# Patient Record
Sex: Male | Born: 2000
Health system: Southern US, Community
[De-identification: ages and names within clinical notes are randomized; demographics above are authoritative.]

## PROBLEM LIST (undated history)

## (undated) DIAGNOSIS — F909 Attention-deficit hyperactivity disorder, unspecified type: Secondary | ICD-10-CM

## (undated) DIAGNOSIS — F988 Other specified behavioral and emotional disorders with onset usually occurring in childhood and adolescence: Secondary | ICD-10-CM

## (undated) DIAGNOSIS — F259 Schizoaffective disorder, unspecified: Secondary | ICD-10-CM

## (undated) HISTORY — PX: CARDIAC SURGERY: SHX584

## (undated) HISTORY — PX: ASD REPAIR: SHX258

---
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
## 2020-01-28 ENCOUNTER — Encounter (HOSPITAL_COMMUNITY): Payer: Self-pay | Admitting: Emergency Medicine

## 2020-01-28 ENCOUNTER — Emergency Department (HOSPITAL_COMMUNITY)
Admission: EM | Admit: 2020-01-28 | Discharge: 2020-01-29 | Disposition: A | Discharge status: Home / Self Care | Payer: Medicaid Other | Attending: Emergency Medicine | Admitting: Emergency Medicine

## 2020-01-28 ENCOUNTER — Other Ambulatory Visit: Payer: Self-pay

## 2020-01-28 DIAGNOSIS — R0602 Shortness of breath: Secondary | ICD-10-CM | POA: Diagnosis not present

## 2020-01-28 DIAGNOSIS — F909 Attention-deficit hyperactivity disorder, unspecified type: Secondary | ICD-10-CM | POA: Diagnosis not present

## 2020-01-28 DIAGNOSIS — F419 Anxiety disorder, unspecified: Secondary | ICD-10-CM

## 2020-01-28 DIAGNOSIS — R202 Paresthesia of skin: Secondary | ICD-10-CM | POA: Insufficient documentation

## 2020-01-28 DIAGNOSIS — R531 Weakness: Secondary | ICD-10-CM | POA: Diagnosis present

## 2020-01-28 DIAGNOSIS — R002 Palpitations: Secondary | ICD-10-CM | POA: Insufficient documentation

## 2020-01-28 DIAGNOSIS — F17228 Nicotine dependence, chewing tobacco, with other nicotine-induced disorders: Secondary | ICD-10-CM | POA: Diagnosis not present

## 2020-01-28 DIAGNOSIS — F17210 Nicotine dependence, cigarettes, uncomplicated: Secondary | ICD-10-CM | POA: Diagnosis not present

## 2020-01-28 DIAGNOSIS — F17290 Nicotine dependence, other tobacco product, uncomplicated: Secondary | ICD-10-CM | POA: Insufficient documentation

## 2020-01-28 LAB — CBC WITH DIFFERENTIAL/PLATELET
Abs Immature Granulocytes: 0.03 10*3/uL (ref 0.00–0.07)
Basophils Absolute: 0 10*3/uL (ref 0.0–0.1)
Basophils Relative: 0 %
Eosinophils Absolute: 0.2 10*3/uL (ref 0.0–0.5)
Eosinophils Relative: 1 %
HCT: 49 % (ref 39.0–52.0)
Hemoglobin: 16.1 g/dL (ref 13.0–17.0)
Immature Granulocytes: 0 %
Lymphocytes Relative: 24 %
Lymphs Abs: 3.1 10*3/uL (ref 0.7–4.0)
MCH: 29.8 pg (ref 26.0–34.0)
MCHC: 32.9 g/dL (ref 30.0–36.0)
MCV: 90.6 fL (ref 80.0–100.0)
Monocytes Absolute: 1.1 10*3/uL — ABNORMAL HIGH (ref 0.1–1.0)
Monocytes Relative: 8 %
Neutro Abs: 8.7 10*3/uL — ABNORMAL HIGH (ref 1.7–7.7)
Neutrophils Relative %: 67 %
Platelets: 361 10*3/uL (ref 150–400)
RBC: 5.41 MIL/uL (ref 4.22–5.81)
RDW: 13.5 % (ref 11.5–15.5)
WBC: 13.1 10*3/uL — ABNORMAL HIGH (ref 4.0–10.5)
nRBC: 0 % (ref 0.0–0.2)

## 2020-01-28 LAB — COMPREHENSIVE METABOLIC PANEL
ALT: 23 U/L (ref 0–44)
AST: 22 U/L (ref 15–41)
Albumin: 4.6 g/dL (ref 3.5–5.0)
Alkaline Phosphatase: 76 U/L (ref 38–126)
Anion gap: 12 (ref 5–15)
BUN: 20 mg/dL (ref 6–20)
CO2: 25 mmol/L (ref 22–32)
Calcium: 9.4 mg/dL (ref 8.9–10.3)
Chloride: 101 mmol/L (ref 98–111)
Creatinine, Ser: 0.87 mg/dL (ref 0.61–1.24)
GFR calc Af Amer: >60 mL/min (ref >60)
GFR calc non Af Amer: >60 mL/min (ref >60)
Glucose, Bld: 91 mg/dL (ref 70–99)
Potassium: 3.6 mmol/L (ref 3.5–5.1)
Sodium: 138 mmol/L (ref 135–145)
Total Bilirubin: 1.3 mg/dL — ABNORMAL HIGH (ref 0.3–1.2)
Total Protein: 7.7 g/dL (ref 6.5–8.1)

## 2020-01-28 LAB — URINALYSIS, ROUTINE W REFLEX MICROSCOPIC
Bilirubin Urine: NEGATIVE
Glucose, UA: NEGATIVE mg/dL
Hgb urine dipstick: NEGATIVE
Ketones, ur: 5 mg/dL — AB
Leukocytes,Ua: NEGATIVE
Nitrite: NEGATIVE
Protein, ur: NEGATIVE mg/dL
Specific Gravity, Urine: 1.02 (ref 1.005–1.030)
pH: 6 (ref 5.0–8.0)

## 2020-01-28 LAB — LIPASE, BLOOD: Lipase: 21 U/L (ref 11–51)

## 2020-01-28 MED ORDER — SODIUM CHLORIDE 0.9% FLUSH
3.0000 mL | Freq: Once | INTRAVENOUS | Status: AC
  Administered 2020-01-28: 3 mL via INTRAVENOUS

## 2020-01-28 NOTE — ED Triage Notes (Signed)
Patient complains of abdominal pain that started today with some nausea. Denies vomiting diarrhea.

## 2020-01-28 NOTE — Discharge Instructions (Addendum)
Your lab tests and exam tonight are reassuring with no signs of dehydration, electrolyte abnormalities and with normal liver and kidney function.  Rest and make sure you are drinking plenty of fluids.  I encourage you to follow up with Carlos Pineda for a recheck of your symptoms and also to explore other options for managing your anxiety and paranoia.  She should know that you are not taking the medicine she prescribed you for this condition.    You can return here also if you have any worsening symptoms.

## 2020-01-30 NOTE — ED Provider Notes (Signed)
[facility] EMERGENCY DEPARTMENT Provider Note   CSN: [id number] Arrival date & time: 01/28/20  1813     History Chief Complaint  Patient presents with  . Abdominal Pain    Carlos Pineda is a 19 y.o. male with a history of ADHD,paranoia, anxiety and prior suicidal ideation presenting for evaluation of generalized weakness, palpitations and shortness of breath which occurred prior to arrival.  He reports sitting at home when he stood up suddenly and felt weak, like his legs almost wouldn't hold him up, but also reports his arms felt weak as well.  He felt his heart was racing, he felt sob and also felt some tingling in his hands.  Somewhat similar to prior anxiety episodes.  Denies chest pain, had nausea without vomiting, also abdominal pain which has resolved.  Also no cough, fevers, vomiting or diarrhea.  He has had no treatment prior to arrival.  Denies any reason for dehydration, has been eating/drinking well.  No tx prior to arrival.   The history is provided by the patient.       Past Medical History:  Diagnosis Date  . ADD (attention deficit disorder)   . ADHD (attention deficit hyperactivity disorder)     Patient Active Problem List   Diagnosis Date Noted  . MDD (major depressive disorder), recurrent, severe, with psychosis (HCC) 12/12/2017    Past Surgical History:  Procedure Laterality Date  . ASD REPAIR    . CARDIAC SURGERY         No family history on file.  Social History   Tobacco Use  . Smoking status: Current Every Day Smoker    Types: E-cigarettes, Cigarettes  . Smokeless tobacco: Current User    Types: Snuff  Substance Use Topics  . Alcohol use: No    Comment: Former ETOH use  . Drug use: No    Comment: Former marijuana use    Home Medications Prior to Admission medications   Medication Sig Start Date End Date Taking? Authorizing Provider  ibuprofen (ADVIL) 200 MG tablet Take 400 mg by mouth every 6 (six) hours as needed for mild pain or  moderate pain.   Yes [provider]  loratadine (ALLERGY RELIEF) 10 MG tablet Take 10 mg by mouth daily as needed for allergies.   Yes [provider]    Allergies    Other  Review of Systems   Review of Systems  Constitutional: Negative for chills and fever.  HENT: Negative for congestion and sore throat.   Eyes: Negative.   Respiratory: Positive for shortness of breath. Negative for chest tightness.   Cardiovascular: Positive for palpitations. Negative for chest pain.  Gastrointestinal: Positive for nausea. Negative for abdominal pain, diarrhea and vomiting.  Genitourinary: Negative.   Musculoskeletal: Negative for arthralgias, joint swelling and neck pain.  Skin: Negative.  Negative for rash and wound.  Neurological: Positive for weakness and numbness. Negative for dizziness, light-headedness and headaches.  Psychiatric/Behavioral: The patient is nervous/anxious.     Physical Exam Updated Vital Signs BP 129/78 (BP Location: Right Arm)   Pulse 79   Temp 98 F (36.7 C) (Oral)   Resp 17   Ht 5\' 7"  (1.702 m)   Wt 63.5 kg   SpO2 99%   BMI 21.93 kg/m   Physical Exam Vitals and nursing note reviewed.  Constitutional:      Appearance: He is well-developed.  HENT:     Head: Normocephalic and atraumatic.  Eyes:     Conjunctiva/sclera: Conjunctivae  normal.  Cardiovascular:     Rate and Rhythm: Normal rate and regular rhythm.     Heart sounds: Normal heart sounds.  Pulmonary:     Effort: Pulmonary effort is normal.     Breath sounds: Normal breath sounds. No wheezing.  Abdominal:     General: Abdomen is flat. Bowel sounds are normal.     Palpations: Abdomen is soft.     Tenderness: There is no abdominal tenderness.     Comments: Normal abd exam.  No guarding, nontender.   Musculoskeletal:        General: Normal range of motion.     Cervical back: Normal range of motion.  Skin:    General: Skin is warm and dry.  Neurological:     General: No focal  deficit present.     Mental Status: He is alert and oriented to person, place, and time.     Cranial Nerves: Cranial nerves are intact. No dysarthria.     Sensory: Sensation is intact.     Motor: Motor function is intact. No weakness or pronator drift.     Coordination: Coordination is intact. Heel to [facility] Test normal.     Comments: Equal grip strength.  Psychiatric:        Attention and Perception: Attention normal.        Mood and Affect: Affect is flat.        Speech: Speech normal.        Behavior: Behavior normal.        Thought Content: Thought content normal.     Comments: Pt with mild flat affect.  Occasional brief episodes of direct eye contact.  Denies SI/HI.      ED Results / Procedures / Treatments   Labs (all labs ordered are listed, but only abnormal results are displayed) Labs Reviewed  COMPREHENSIVE METABOLIC PANEL - Abnormal; Notable for the following components:      Result Value   Total Bilirubin 1.3 (*)    All other components within normal limits  URINALYSIS, ROUTINE W REFLEX MICROSCOPIC - Abnormal; Notable for the following components:   Ketones, ur 5 (*)    All other components within normal limits  CBC WITH DIFFERENTIAL/PLATELET - Abnormal; Notable for the following components:   WBC 13.1 (*)    Neutro Abs 8.7 (*)    Monocytes Absolute 1.1 (*)    All other components within normal limits  LIPASE, BLOOD    EKG None  Radiology No results found.  Procedures Procedures (including critical care time)  Medications Ordered in ED Medications  sodium chloride flush (NS) 0.9 % injection 3 mL (3 mLs Intravenous Given 01/28/20 2237)    ED Course  I have reviewed the triage vital signs and the nursing notes.  Pertinent labs & imaging results that were available during my care of the patient were reviewed by me and considered in my medical decision making (see chart for details).    MDM Rules/Calculators/A&P                     Pt with normal exam  without abdominal pain here, no sx elicited with exam.  Ambulatory without weakness, VSS, labs unremarkable except for an elevated wbc count of unclear etiology, suspect from acute stress reaction from suspected anxiety/panic attack.  He reports used to be on prozac and another medication for anxiety and paranoia, stopped taking last summer.  Advised f/u with pcp to discuss other options for managing these  sx, who is not aware he has stopped taking.  Also discussed return here sooner for any escalation of sx.   Pt denies SI.  Final Clinical Impression(s) / ED Diagnoses Final diagnoses:  Episode of generalized weakness  Anxiety    Rx / DC Orders ED Discharge Orders    None       Landis Martins 01/30/20 1629    Sherwood Gambler, MD 01/30/20 1735

## 2020-07-29 DIAGNOSIS — R079 Chest pain, unspecified: Secondary | ICD-10-CM | POA: Diagnosis not present

## 2020-07-29 DIAGNOSIS — R0600 Dyspnea, unspecified: Secondary | ICD-10-CM | POA: Diagnosis not present

## 2020-08-23 ENCOUNTER — Emergency Department (HOSPITAL_COMMUNITY)
Admission: EM | Admit: 2020-08-23 | Discharge: 2020-08-23 | Disposition: A | Discharge status: Home / Self Care | Payer: Medicaid Other | Attending: Emergency Medicine | Admitting: Emergency Medicine

## 2020-08-23 ENCOUNTER — Emergency Department (HOSPITAL_COMMUNITY): Payer: Medicaid Other

## 2020-08-23 ENCOUNTER — Other Ambulatory Visit: Payer: Self-pay

## 2020-08-23 ENCOUNTER — Encounter (HOSPITAL_COMMUNITY): Payer: Self-pay | Admitting: Emergency Medicine

## 2020-08-23 DIAGNOSIS — R519 Headache, unspecified: Secondary | ICD-10-CM | POA: Diagnosis present

## 2020-08-23 DIAGNOSIS — U071 COVID-19: Secondary | ICD-10-CM | POA: Diagnosis not present

## 2020-08-23 DIAGNOSIS — F17210 Nicotine dependence, cigarettes, uncomplicated: Secondary | ICD-10-CM | POA: Diagnosis not present

## 2020-08-23 DIAGNOSIS — F909 Attention-deficit hyperactivity disorder, unspecified type: Secondary | ICD-10-CM | POA: Diagnosis not present

## 2020-08-23 LAB — SARS CORONAVIRUS 2 BY RT PCR (HOSPITAL ORDER, PERFORMED IN ~~LOC~~ HOSPITAL LAB): SARS Coronavirus 2: POSITIVE — AB

## 2020-08-23 NOTE — ED Provider Notes (Signed)
[facility] EMERGENCY DEPARTMENT Provider Note   CSN: [id number] Arrival date & time: 08/23/20  0025   Time seen 2:47 AM  History Chief Complaint  Patient presents with  . Multiple Complaints    Carlos Pineda is a 19 y.o. male.  HPI   Patient states Thursday, September 9 he started feeling "weird".  He states he could not sleep.  The following day he had a headache that in the front just hurts in the back is spiky he has had some nausea but no vomiting except when he induced it.  He denies diarrhea.  He has had a mild cough and mild sore throat and some mild rhinorrhea without sneezing.  He denies loss of sense of taste or smell.  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  Patient has not had the Covid vaccine  PCP April Manson, NP   Past Medical History:  Diagnosis Date  . ADD (attention deficit disorder)   . ADHD (attention deficit hyperactivity disorder)     Patient Active Problem List   Diagnosis Date Noted  . MDD (major depressive disorder), recurrent, severe, with psychosis (HCC) 12/12/2017    Past Surgical History:  Procedure Laterality Date  . ASD REPAIR    . CARDIAC SURGERY         No family history on file.  Social History   Tobacco Use  . Smoking status: Current Every Day Smoker    Types: E-cigarettes, Cigarettes  . Smokeless tobacco: Current User    Types: Snuff  Vaping Use  . Vaping Use: Never used  Substance Use Topics  . Alcohol use: Yes    Alcohol/week: 24.0 standard drinks    Types: 24 Cans of beer per week    Comment: Former ETOH use  . Drug use: No    Comment: Former marijuana use  States he quit his job in the past couple days  Home Medications Prior to Admission medications   Medication Sig Start Date End Date Taking? Authorizing Provider  ibuprofen (ADVIL) 200 MG tablet Take 400 mg by mouth every 6 (six) hours as needed for mild pain or moderate pain.    [provider]  loratadine (ALLERGY RELIEF) 10 MG tablet Take 10 mg by mouth daily as needed for allergies.    [provider]    Allergies    Other  Review of Systems   Review of Systems  All other systems reviewed and are negative.   Physical Exam Updated Vital Signs BP 138/81 (BP Location: Right Arm)   Pulse 80   Temp 99.3 F (37.4 C) (Oral)   Resp 18   Ht 5\' 7"  (1.702 m)   Wt 63.5 kg   SpO2 99%   BMI 21.93 kg/m   Physical Exam Vitals and nursing note reviewed.  Constitutional:      General: He is not in acute distress.    Appearance: Normal appearance. He is normal weight.  HENT:     Head: Normocephalic and atraumatic.     Right Ear: External ear normal.     Left Ear: External ear normal.     Nose: Nose normal.     Mouth/Throat:     Mouth: Mucous membranes are moist.     Pharynx: No oropharyngeal exudate or posterior oropharyngeal erythema.  Eyes:     Extraocular Movements: Extraocular movements intact.     Conjunctiva/sclera: Conjunctivae normal.  Pupils: Pupils are equal, round, and reactive to light.  Cardiovascular:     Rate and Rhythm: Normal rate and regular rhythm.     Pulses: Normal pulses.     Heart sounds: Normal heart sounds.  Pulmonary:     Effort: Pulmonary effort is normal. No respiratory distress.  Musculoskeletal:        General: Normal range of motion.     Cervical back: Normal range of motion.  Skin:    General: Skin is warm and dry.  Neurological:     General: No focal deficit present.     Mental Status: He is alert and oriented to person, place, and time.     Cranial Nerves: No cranial nerve deficit.  Psychiatric:        Mood and Affect: Mood normal.        Behavior: Behavior normal.        Thought Content: Thought content normal.     ED Results / Procedures / Treatments   Labs (all labs ordered are listed, but only abnormal results are displayed) Labs Reviewed  SARS CORONAVIRUS 2 BY RT PCR (HOSPITAL ORDER,  PERFORMED IN [place] [facility]) - Abnormal; Notable for the following components:      Result Value   SARS Coronavirus 2 POSITIVE (*)    All other components within normal limits    EKG None  Radiology DG Chest Port 1 View  Result Date: 08/23/2020 CLINICAL DATA:  Cough.  COVID positive. EXAM: PORTABLE CHEST 1 VIEW COMPARISON:  July 29, 2020 FINDINGS: There is no evidence of acute infiltrate, pleural effusion or pneumothorax. The heart size and mediastinal contours are within normal limits. The visualized skeletal structures are unremarkable. IMPRESSION: No active cardiopulmonary disease. Electronically Signed   By: Aram Candela M.D.   On: 08/23/2020 03:09    Procedures Procedures (including critical care time)  Medications Ordered in ED Medications - No data to display  ED Course  I have reviewed the triage vital signs and the nursing notes.  Pertinent labs & imaging results that were available during my care of the patient were reviewed by me and considered in my medical decision making (see chart for details).    MDM Rules/Calculators/A&P                          During his exam patient stated he was "asked me to set me but he could not remember what it was, with his headache and loss of train of thought I reassured him this was the brain fog seen in Covid.  Portable chest x-ray was done.  Unfortunately patient does not meet any criteria to get monoclonal antibody infusion in the ED.  His pulse ox is normal and he does not meet criteria for admission.  He will be discharged home with instructions of over-the-counter medications to take.  Carlos Pineda was evaluated in Emergency Department on 08/23/2020 for the symptoms described in the history of present illness. He was evaluated in the context of the global COVID-19 pandemic, which necessitated consideration that the patient might be at risk for infection with the SARS-CoV-2 virus that causes COVID-19. Institutional  protocols and algorithms that pertain to the evaluation of patients at risk for COVID-19 are in a state of rapid change based on information released by regulatory bodies including the CDC and federal and state organizations. These policies and algorithms were followed during the patient's care in the ED.  Final Clinical Impression(s) / ED Diagnoses Final diagnoses:  COVID-19 virus infection    Rx / DC Orders ED Discharge Orders    None     Plan discharge  Devoria Albe, MD, Concha Pyo, MD 08/23/20 [phone]

## 2020-08-23 NOTE — Discharge Instructions (Addendum)
Your chest x-ray did not show evidence of pneumonia yet.  Drink plenty of fluids.  You can take acetaminophen 650 mg plus ibuprofen 400 mg orally every 6 hours as needed for body aches or fever.  Start taking zinc 50 mg over-the-counter, vitamin D and vitamin C, and aspirin 81 mg a day.  These will all help you fight the infection.  You can take Mucinex DM over-the-counter if you need it for cough.  While in the drugstore you should get a monitor to measure your oxygen.  If you start feeling short of breath or struggle to breathe and your oxygen level is less than 90% you should return to the emergency room and be prepared to be admitted.

## 2020-08-23 NOTE — ED Triage Notes (Addendum)
Pt c/o body aches, headaches, and "feeling like death" x 2 days.

## 2020-08-23 NOTE — ED Notes (Signed)
Date and time results received: 08/23/20 0202  Test: Covid  Critical Value: positive  Name of Provider Notified: Devoria Albe, MD  Orders Received? Or Actions Taken?: Actions Taken: n/a

## 2021-08-17 ENCOUNTER — Other Ambulatory Visit: Payer: Self-pay

## 2021-08-17 DIAGNOSIS — R1031 Right lower quadrant pain: Secondary | ICD-10-CM | POA: Insufficient documentation

## 2021-08-17 DIAGNOSIS — Y902 Blood alcohol level of 40-59 mg/100 ml: Secondary | ICD-10-CM | POA: Diagnosis not present

## 2021-08-17 DIAGNOSIS — F17210 Nicotine dependence, cigarettes, uncomplicated: Secondary | ICD-10-CM | POA: Insufficient documentation

## 2021-08-18 ENCOUNTER — Emergency Department (HOSPITAL_COMMUNITY)
Admission: EM | Admit: 2021-08-18 | Discharge: 2021-08-18 | Disposition: A | Discharge status: Home / Self Care | Payer: Medicaid Other | Attending: Emergency Medicine | Admitting: Emergency Medicine

## 2021-08-18 ENCOUNTER — Emergency Department (HOSPITAL_COMMUNITY): Payer: Medicaid Other

## 2021-08-18 ENCOUNTER — Encounter (HOSPITAL_COMMUNITY): Payer: Self-pay

## 2021-08-18 DIAGNOSIS — R1030 Lower abdominal pain, unspecified: Secondary | ICD-10-CM

## 2021-08-18 LAB — URINALYSIS, ROUTINE W REFLEX MICROSCOPIC
Bilirubin Urine: NEGATIVE
Glucose, UA: NEGATIVE mg/dL
Hgb urine dipstick: NEGATIVE
Ketones, ur: NEGATIVE mg/dL
Leukocytes,Ua: NEGATIVE
Nitrite: NEGATIVE
Protein, ur: NEGATIVE mg/dL
Specific Gravity, Urine: 1.01 (ref 1.005–1.030)
pH: 5.5 (ref 5.0–8.0)

## 2021-08-18 LAB — CBC WITH DIFFERENTIAL/PLATELET
Abs Immature Granulocytes: 0.03 10*3/uL (ref 0.00–0.07)
Basophils Absolute: 0 10*3/uL (ref 0.0–0.1)
Basophils Relative: 0 %
Eosinophils Absolute: 0.2 10*3/uL (ref 0.0–0.5)
Eosinophils Relative: 2 %
HCT: 46.1 % (ref 39.0–52.0)
Hemoglobin: 15.5 g/dL (ref 13.0–17.0)
Immature Granulocytes: 0 %
Lymphocytes Relative: 33 %
Lymphs Abs: 3.9 10*3/uL (ref 0.7–4.0)
MCH: 30.6 pg (ref 26.0–34.0)
MCHC: 33.6 g/dL (ref 30.0–36.0)
MCV: 90.9 fL (ref 80.0–100.0)
Monocytes Absolute: 0.8 10*3/uL (ref 0.1–1.0)
Monocytes Relative: 7 %
Neutro Abs: 7 10*3/uL (ref 1.7–7.7)
Neutrophils Relative %: 58 %
Platelets: 346 10*3/uL (ref 150–400)
RBC: 5.07 MIL/uL (ref 4.22–5.81)
RDW: 13.3 % (ref 11.5–15.5)
WBC: 12 10*3/uL — ABNORMAL HIGH (ref 4.0–10.5)
nRBC: 0 % (ref 0.0–0.2)

## 2021-08-18 LAB — COMPREHENSIVE METABOLIC PANEL
ALT: 17 U/L (ref 0–44)
AST: 19 U/L (ref 15–41)
Albumin: 4.8 g/dL (ref 3.5–5.0)
Alkaline Phosphatase: 76 U/L (ref 38–126)
Anion gap: 6 (ref 5–15)
BUN: 16 mg/dL (ref 6–20)
CO2: 25 mmol/L (ref 22–32)
Calcium: 9 mg/dL (ref 8.9–10.3)
Chloride: 106 mmol/L (ref 98–111)
Creatinine, Ser: 0.91 mg/dL (ref 0.61–1.24)
GFR, Estimated: >60 mL/min (ref >60)
Glucose, Bld: 91 mg/dL (ref 70–99)
Potassium: 3.2 mmol/L — ABNORMAL LOW (ref 3.5–5.1)
Sodium: 137 mmol/L (ref 135–145)
Total Bilirubin: 0.8 mg/dL (ref 0.3–1.2)
Total Protein: 7.9 g/dL (ref 6.5–8.1)

## 2021-08-18 LAB — ETHANOL: Alcohol, Ethyl (B): 52 mg/dL — ABNORMAL HIGH (ref <10)

## 2021-08-18 LAB — LIPASE, BLOOD: Lipase: 24 U/L (ref 11–51)

## 2021-08-18 MED ORDER — ONDANSETRON HCL 4 MG/2ML IJ SOLN
4.0000 mg | Freq: Once | INTRAMUSCULAR | Status: AC
  Administered 2021-08-18: 4 mg via INTRAVENOUS
  Filled 2021-08-18: qty 2

## 2021-08-18 MED ORDER — SODIUM CHLORIDE 0.9 % IV BOLUS
1000.0000 mL | Freq: Once | INTRAVENOUS | Status: AC
  Administered 2021-08-18: 1000 mL via INTRAVENOUS

## 2021-08-18 MED ORDER — MORPHINE SULFATE (PF) 4 MG/ML IV SOLN
4.0000 mg | Freq: Once | INTRAVENOUS | Status: AC
  Administered 2021-08-18: 4 mg via INTRAVENOUS
  Filled 2021-08-18: qty 1

## 2021-08-18 MED ORDER — IOHEXOL 350 MG/ML SOLN
80.0000 mL | Freq: Once | INTRAVENOUS | Status: AC | PRN
  Administered 2021-08-18: 80 mL via INTRAVENOUS

## 2021-08-18 NOTE — Discharge Instructions (Addendum)
Magnesium citrate: Drink the majority of the 10 ounce bottle for relief of constipation.  This medication is available over-the-counter.  Return to the emergency department if you develop worsening pain, high fever, bloody stools, or other new and concerning symptoms.

## 2021-08-18 NOTE — ED Provider Notes (Signed)
[facility] EMERGENCY DEPARTMENT Provider Note   CSN: [id number] Arrival date & time: 08/17/21  2317     History Chief Complaint  Patient presents with   Abdominal Pain    Carlos Pineda is a 20 y.o. male.  Patient is a 20 year old male with past medical history of ADHD and prior ASD repair at the age of 56.  Patient presenting today for evaluation of right Pineda quadrant pain.  This began at approximately 2:00 this afternoon in the absence of any injury or trauma.  The pain started in the area of his umbilicus, then localized to the right Pineda quadrant this evening.  The pain is constant and worse when he moves or pushes on the area.  He denies any bowel or bladder complaints.  He denies any fevers or chills.  The history is provided by the patient.  Abdominal Pain Pain location:  RLQ Pain quality: aching   Pain radiates to:  Does not radiate Pain severity:  Moderate Onset quality:  Gradual Duration:  12 hours Timing:  Constant Progression:  Worsening Chronicity:  New Relieved by:  Nothing Worsened by:  Movement and palpation     Past Medical History:  Diagnosis Date   ADD (attention deficit disorder)    ADHD (attention deficit hyperactivity disorder)     Patient Active Problem List   Diagnosis Date Noted   MDD (major depressive disorder), recurrent, severe, with psychosis (HCC) 12/12/2017    Past Surgical History:  Procedure Laterality Date   ASD REPAIR     CARDIAC SURGERY         History reviewed. No pertinent family history.  Social History   Tobacco Use   Smoking status: Every Day    Types: E-cigarettes, Cigarettes   Smokeless tobacco: Current    Types: Snuff  Vaping Use   Vaping Use: Never used  Substance Use Topics   Alcohol use: Yes    Alcohol/week: 24.0 standard drinks    Types: 24 Cans of beer per week    Comment: Former ETOH use   Drug use: No    Comment: Former marijuana use    Home Medications Prior to Admission medications    Medication Sig Start Date End Date Taking? Authorizing Provider  ibuprofen (ADVIL) 200 MG tablet Take 400 mg by mouth every 6 (six) hours as needed for mild pain or moderate pain.    [provider]  loratadine (ALLERGY RELIEF) 10 MG tablet Take 10 mg by mouth daily as needed for allergies.    [provider]    Allergies    Other  Review of Systems   Review of Systems  Gastrointestinal:  Positive for abdominal pain.  All other systems reviewed and are negative.  Physical Exam Updated Vital Signs BP (!) 153/84   Pulse 79   Temp 98.5 F (36.9 C)   Resp (!) 21   Ht 5\' 8"  (1.727 m)   Wt 63.5 kg   SpO2 99%   BMI 21.29 kg/m   Physical Exam Vitals and nursing note reviewed.  Constitutional:      General: He is not in acute distress.    Appearance: He is well-developed. He is not diaphoretic.  HENT:     Head: Normocephalic and atraumatic.  Cardiovascular:     Rate and Rhythm: Normal rate and regular rhythm.     Heart sounds: No murmur heard.   No friction rub.  Pulmonary:     Effort: Pulmonary effort is normal. No respiratory  distress.     Breath sounds: Normal breath sounds. No wheezing or rales.  Abdominal:     General: Bowel sounds are normal. There is no distension.     Palpations: Abdomen is soft.     Tenderness: There is abdominal tenderness in the right Pineda quadrant and suprapubic area. There is no right CVA tenderness, left CVA tenderness, guarding or rebound.  Musculoskeletal:        General: Normal range of motion.     Cervical back: Normal range of motion and neck supple.  Skin:    General: Skin is warm and dry.  Neurological:     Mental Status: He is alert and oriented to person, place, and time.     Coordination: Coordination normal.    ED Results / Procedures / Treatments   Labs (all labs ordered are listed, but only abnormal results are displayed) Labs Reviewed - No data to display  EKG None  Radiology No results  found.  Procedures Procedures   Medications Ordered in ED Medications  sodium chloride 0.9 % bolus 1,000 mL (has no administration in time range)  ondansetron (ZOFRAN) injection 4 mg (has no administration in time range)  morphine 4 MG/ML injection 4 mg (has no administration in time range)    ED Course  I have reviewed the triage vital signs and the nursing notes.  Pertinent labs & imaging results that were available during my care of the patient were reviewed by me and considered in my medical decision making (see chart for details).    MDM Rules/Calculators/A&P  Patient presenting here with complaints of right Pineda quadrant pain since earlier this afternoon.  Patient arrives here afebrile, but does have a white count of 12,000.  He does have some tenderness to palpation in the right Pineda quadrant and suprapubic region, but CT scan fails to demonstrate appendicitis.  There are no other abnormal findings that would explain his discomfort.  Patient seems to be feeling better after receiving medications here in the ER.  At this point, I feel as though he can safely be discharged.  As far as the cause of his pain, he does seem to have a significant quantity of stool.  Patient will be advised to take magnesium citrate and return as needed if symptoms worsen or change.  Final Clinical Impression(s) / ED Diagnoses Final diagnoses:  None    Rx / DC Orders ED Discharge Orders     None        Carlos Lyons, MD 08/18/21 [phone]

## 2021-08-18 NOTE — ED Notes (Signed)
Pt rec'd d/c papers. Ambulatory to lobby with grandmother.

## 2021-08-18 NOTE — ED Triage Notes (Addendum)
Right side pain since 2pm. Worse with movement and palpation.   Had around 5 beers after 3pm. Last beer around 1030pm Denies n/v/d Last ate around 5pm

## 2021-08-18 NOTE — ED Notes (Signed)
Pt in bed on phone 

## 2021-10-11 DIAGNOSIS — Z79899 Other long term (current) drug therapy: Secondary | ICD-10-CM | POA: Diagnosis not present

## 2021-10-11 DIAGNOSIS — S41112A Laceration without foreign body of left upper arm, initial encounter: Secondary | ICD-10-CM | POA: Diagnosis not present

## 2021-10-11 DIAGNOSIS — S0101XA Laceration without foreign body of scalp, initial encounter: Secondary | ICD-10-CM | POA: Diagnosis not present

## 2021-10-11 DIAGNOSIS — S0003XA Contusion of scalp, initial encounter: Secondary | ICD-10-CM | POA: Diagnosis not present

## 2021-10-11 DIAGNOSIS — S301XXA Contusion of abdominal wall, initial encounter: Secondary | ICD-10-CM | POA: Diagnosis not present

## 2021-10-11 DIAGNOSIS — S59902A Unspecified injury of left elbow, initial encounter: Secondary | ICD-10-CM | POA: Diagnosis not present

## 2021-10-11 DIAGNOSIS — S199XXA Unspecified injury of neck, initial encounter: Secondary | ICD-10-CM | POA: Diagnosis not present

## 2021-10-11 DIAGNOSIS — J322 Chronic ethmoidal sinusitis: Secondary | ICD-10-CM | POA: Diagnosis not present

## 2021-10-11 DIAGNOSIS — Z041 Encounter for examination and observation following transport accident: Secondary | ICD-10-CM | POA: Diagnosis not present

## 2021-10-26 ENCOUNTER — Encounter (HOSPITAL_COMMUNITY): Payer: Self-pay

## 2021-10-26 ENCOUNTER — Emergency Department (HOSPITAL_COMMUNITY)
Admission: EM | Admit: 2021-10-26 | Discharge: 2021-10-26 | Disposition: A | Discharge status: Home / Self Care | Payer: Medicaid Other | Attending: Emergency Medicine | Admitting: Emergency Medicine

## 2021-10-26 ENCOUNTER — Emergency Department (HOSPITAL_COMMUNITY): Payer: Medicaid Other

## 2021-10-26 DIAGNOSIS — R0602 Shortness of breath: Secondary | ICD-10-CM | POA: Insufficient documentation

## 2021-10-26 DIAGNOSIS — R0789 Other chest pain: Secondary | ICD-10-CM | POA: Diagnosis not present

## 2021-10-26 DIAGNOSIS — F410 Panic disorder [episodic paroxysmal anxiety] without agoraphobia: Secondary | ICD-10-CM | POA: Diagnosis not present

## 2021-10-26 DIAGNOSIS — Z79899 Other long term (current) drug therapy: Secondary | ICD-10-CM | POA: Insufficient documentation

## 2021-10-26 DIAGNOSIS — M546 Pain in thoracic spine: Secondary | ICD-10-CM | POA: Diagnosis not present

## 2021-10-26 DIAGNOSIS — F17210 Nicotine dependence, cigarettes, uncomplicated: Secondary | ICD-10-CM | POA: Diagnosis not present

## 2021-10-26 DIAGNOSIS — R079 Chest pain, unspecified: Secondary | ICD-10-CM | POA: Diagnosis present

## 2021-10-26 LAB — CBC WITH DIFFERENTIAL/PLATELET
Abs Immature Granulocytes: 0.05 10*3/uL (ref 0.00–0.07)
Basophils Absolute: 0.1 10*3/uL (ref 0.0–0.1)
Basophils Relative: 0 %
Eosinophils Absolute: 0.2 10*3/uL (ref 0.0–0.5)
Eosinophils Relative: 1 %
HCT: 47 % (ref 39.0–52.0)
Hemoglobin: 16.2 g/dL (ref 13.0–17.0)
Immature Granulocytes: 0 %
Lymphocytes Relative: 32 %
Lymphs Abs: 3.8 10*3/uL (ref 0.7–4.0)
MCH: 30.2 pg (ref 26.0–34.0)
MCHC: 34.5 g/dL (ref 30.0–36.0)
MCV: 87.5 fL (ref 80.0–100.0)
Monocytes Absolute: 0.9 10*3/uL (ref 0.1–1.0)
Monocytes Relative: 7 %
Neutro Abs: 7.2 10*3/uL (ref 1.7–7.7)
Neutrophils Relative %: 60 %
Platelets: 463 10*3/uL — ABNORMAL HIGH (ref 150–400)
RBC: 5.37 MIL/uL (ref 4.22–5.81)
RDW: 12.8 % (ref 11.5–15.5)
WBC: 12.2 10*3/uL — ABNORMAL HIGH (ref 4.0–10.5)
nRBC: 0 % (ref 0.0–0.2)

## 2021-10-26 LAB — BASIC METABOLIC PANEL
Anion gap: 13 (ref 5–15)
BUN: 17 mg/dL (ref 6–20)
CO2: 21 mmol/L — ABNORMAL LOW (ref 22–32)
Calcium: 9.9 mg/dL (ref 8.9–10.3)
Chloride: 102 mmol/L (ref 98–111)
Creatinine, Ser: 0.93 mg/dL (ref 0.61–1.24)
GFR, Estimated: >60 mL/min (ref >60)
Glucose, Bld: 102 mg/dL — ABNORMAL HIGH (ref 70–99)
Potassium: 3 mmol/L — ABNORMAL LOW (ref 3.5–5.1)
Sodium: 136 mmol/L (ref 135–145)

## 2021-10-26 LAB — HEPATIC FUNCTION PANEL
ALT: 16 U/L (ref 0–44)
AST: 22 U/L (ref 15–41)
Albumin: 4.9 g/dL (ref 3.5–5.0)
Alkaline Phosphatase: 72 U/L (ref 38–126)
Bilirubin, Direct: <0.1 mg/dL (ref 0.0–0.2)
Total Bilirubin: 1 mg/dL (ref 0.3–1.2)
Total Protein: 8.2 g/dL — ABNORMAL HIGH (ref 6.5–8.1)

## 2021-10-26 LAB — RAPID URINE DRUG SCREEN, HOSP PERFORMED
Amphetamines: NOT DETECTED
Barbiturates: NOT DETECTED
Benzodiazepines: NOT DETECTED
Cocaine: NOT DETECTED
Opiates: NOT DETECTED
Tetrahydrocannabinol: NOT DETECTED

## 2021-10-26 LAB — TROPONIN I (HIGH SENSITIVITY)
Troponin I (High Sensitivity): 2 ng/L (ref <18)
Troponin I (High Sensitivity): <2 ng/L (ref <18)

## 2021-10-26 LAB — LIPASE, BLOOD: Lipase: 24 U/L (ref 11–51)

## 2021-10-26 MED ORDER — FAMOTIDINE 20 MG PO TABS
20.0000 mg | ORAL_TABLET | Freq: Once | ORAL | Status: AC
  Administered 2021-10-26: 20 mg via ORAL
  Filled 2021-10-26: qty 1

## 2021-10-26 MED ORDER — POTASSIUM CHLORIDE CRYS ER 20 MEQ PO TBCR
40.0000 meq | EXTENDED_RELEASE_TABLET | Freq: Once | ORAL | Status: AC
  Administered 2021-10-26: 40 meq via ORAL
  Filled 2021-10-26: qty 2

## 2021-10-26 NOTE — ED Notes (Signed)
Update given to grandmother per pt request. Pt is restless.

## 2021-10-26 NOTE — ED Provider Notes (Signed)
[facility] Provider Note   CSN: [id number] Arrival date & time: 10/26/21  1137     History No chief complaint on file.   Carlos Pineda is a 20 y.o. male with history of ADD and depression presenting for evaluation of chest pain which started over 1 hour ago while at work.  He was not exerting when this symptom started.  He describes pain that started in his upper back and has since radiated into the chest.  He also describes some vague shortness of breath initially but this is resolved, his chest pain has also almost resolved since arriving here.  He has had no fevers or chills, cough, diaphoresis.  Also denies recognized palpitations.  He did have a "2 shot [employer] coffee" prior to starting his workday.  He does endorse drinking daily caffeine, this morning's intake was typical.  He had some nausea without emesis which has resolved.  He is unable to describe the nature of the pain.  He does have a history of ASD with surgical repair is a 10-year-old.  He is not under the care of a cardiologist.  He has had no treatment prior to arrival.   The history is provided by the patient.      Past Medical History:  Diagnosis Date   ADD (attention deficit disorder)    ADHD (attention deficit hyperactivity disorder)     Patient Active Problem List   Diagnosis Date Noted   MDD (major depressive disorder), recurrent, severe, with psychosis ([place]) 12/12/2017    Past Surgical History:  Procedure Laterality Date   ASD REPAIR     CARDIAC SURGERY         No family history on file.  Social History   Tobacco Use   Smoking status: Every Day    Types: E-cigarettes, Cigarettes   Smokeless tobacco: Current    Types: Snuff  Vaping Use   Vaping Use: Never used  Substance Use Topics   Alcohol use: Yes    Alcohol/week: 24.0 standard drinks    Types: 24 Cans of beer per week    Comment: Former ETOH use   Drug use: No    Comment: Former marijuana use    Home  Medications Prior to Admission medications   Not on File    Allergies    Other  Review of Systems   Review of Systems  Constitutional:  Negative for diaphoresis and fever.  HENT:  Negative for congestion and sore throat.   Eyes: Negative.   Respiratory:  Positive for shortness of breath. Negative for cough, chest tightness, wheezing and stridor.   Cardiovascular:  Positive for chest pain. Negative for palpitations and leg swelling.  Gastrointestinal:  Positive for nausea. Negative for abdominal pain and vomiting.  Genitourinary: Negative.   Musculoskeletal:  Negative for arthralgias, joint swelling and neck pain.  Skin: Negative.  Negative for rash and wound.  Neurological:  Negative for dizziness, weakness, light-headedness, numbness and headaches.  Psychiatric/Behavioral: Negative.    All other systems reviewed and are negative.  Physical Exam Updated Vital Signs BP (!) 110/58 (BP Location: Right Arm)   Pulse 60   Temp 97.8 F (36.6 C) (Oral)   Resp 16   Ht 5\' 7"  (1.702 m)   Wt 63.5 kg   SpO2 97%   BMI 21.93 kg/m   Physical Exam Vitals and nursing note reviewed.  Constitutional:      Appearance: He is well-developed.  HENT:     Head: Normocephalic  and atraumatic.  Eyes:     Conjunctiva/sclera: Conjunctivae normal.  Cardiovascular:     Rate and Rhythm: Normal rate and regular rhythm.     Heart sounds: Normal heart sounds. No murmur heard.   No friction rub.  Pulmonary:     Effort: Pulmonary effort is normal. No respiratory distress.     Breath sounds: Normal breath sounds. No wheezing or rhonchi.  Abdominal:     General: Bowel sounds are normal. There is no distension.     Palpations: Abdomen is soft.     Tenderness: There is no abdominal tenderness. There is no guarding.  Musculoskeletal:        General: Normal range of motion.     Cervical back: Normal range of motion.  Skin:    General: Skin is warm and dry.  Neurological:     Mental Status: He is  alert.  Psychiatric:        Mood and Affect: Affect is flat.        Behavior: Behavior normal. Behavior is cooperative.    ED Results / Procedures / Treatments   Labs (all labs ordered are listed, but only abnormal results are displayed) Labs Reviewed  BASIC METABOLIC PANEL - Abnormal; Notable for the following components:      Result Value   Potassium 3.0 (*)    CO2 21 (*)    Glucose, Bld 102 (*)    All other components within normal limits  CBC WITH DIFFERENTIAL/PLATELET - Abnormal; Notable for the following components:   WBC 12.2 (*)    Platelets 463 (*)    All other components within normal limits  HEPATIC FUNCTION PANEL - Abnormal; Notable for the following components:   Total Protein 8.2 (*)    All other components within normal limits  RAPID URINE DRUG SCREEN, HOSP PERFORMED  LIPASE, BLOOD  TROPONIN I (HIGH SENSITIVITY)  TROPONIN I (HIGH SENSITIVITY)    EKG EKG Interpretation  Date/Time:  Wednesday October 26 2021 11:46:43 EST Ventricular Rate:  82 PR Interval:  138 QRS Duration: 122 QT Interval:  392 QTC Calculation: 457 R Axis:   -47 Text Interpretation: Normal sinus rhythm with sinus arrhythmia Left axis deviation Non-specific intra-ventricular conduction delay Nonspecific ST and T wave abnormality Abnormal ECG No significant change since last tracing Confirmed by Susy Frizzle [phone]) on 10/26/2021 12:00:09 PM  Radiology DG Chest Port 1 View  Result Date: 10/26/2021 CLINICAL DATA:  Chest pain.  History of prior heart valve surgery. EXAM: PORTABLE CHEST 1 VIEW COMPARISON:  10/11/2021 and older studies. FINDINGS: Cardiac silhouette is normal in size and configuration. Normal mediastinal and hilar contours. Clear lungs.  No pleural effusion or pneumothorax. Skeletal structures are grossly intact. IMPRESSION: No active disease. Electronically Signed   By: Amie Portland M.D.   On: 10/26/2021 12:27    Procedures Procedures   Medications Ordered in  ED Medications  potassium chloride SA (KLOR-CON) CR tablet 40 mEq (40 mEq Oral Given 10/26/21 1359)  famotidine (PEPCID) tablet 20 mg (20 mg Oral Given 10/26/21 1359)    ED Course  I have reviewed the triage vital signs and the nursing notes.  Pertinent labs & imaging results that were available during my care of the patient were reviewed by me and considered in my medical decision making (see chart for details).    MDM Rules/Calculators/A&P  Patient's labs, chest x-ray and EKG were reviewed and discussed with patient.  His symptoms have resolved, no indication of cardiac source of symptoms.  He is not hypoxic, normal pulse rate, PERC negative, doubt PE.  He was somewhat subdued during this ED visit, we discussed anxiety, depression and he does endorse daily episodes of panic attacks, he endorses today's episode may have been triggered by such an event.  He was referred by his PCP to psychiatry.  He has been on depression medications in the past but does not like their side effects and has not found one that has been helpful.  He has not yet taken the effort to arrange an appointment with psychiatry.  He denies SI or HI.  He was interested in local [facility] referrals for psychiatric care which was provided.  The patient appears reasonably screened and/or stabilized for discharge and I doubt any other medical condition or other [facility] requiring further screening, evaluation, or treatment in the ED at this time prior to discharge.  Final Clinical Impression(s) / ED Diagnoses Final diagnoses:  Atypical chest pain    Rx / DC Orders ED Discharge Orders     None        Landis Martins 10/27/21 1306    Truddie Hidden, MD 10/27/21 [phone]

## 2021-10-26 NOTE — Discharge Instructions (Signed)
Your exam, labs, EKG and chest x-ray are all normal today and reassuring.  As discussed, you may have experienced a panic attack as the source of today's events.  I do encourage you to follow-up with psychiatry as was recommended by your primary provider.  Our local behavioral health group listed below may be a good choice for you.  Plan to return here for any worsening symptoms.  I would suggest using caution with the quantity of caffeine you are consuming as this can also play a role.

## 2021-10-26 NOTE — ED Notes (Signed)
Pt anxious. Placed on 2L [place] for comfort per request.

## 2021-10-26 NOTE — ED Triage Notes (Addendum)
Chest pain over the last hour and DIB, reports drinking energy drink and tobacco (dip).  Pt alert and oriented, skin warm and dry,  pt has a heart surgery history unsure of what.

## 2022-04-12 ENCOUNTER — Other Ambulatory Visit: Payer: Self-pay

## 2022-04-12 ENCOUNTER — Emergency Department (HOSPITAL_COMMUNITY): Payer: Medicaid Other

## 2022-04-12 ENCOUNTER — Emergency Department (HOSPITAL_COMMUNITY)
Admission: EM | Admit: 2022-04-12 | Discharge: 2022-04-13 | Disposition: A | Discharge status: Other Acute Inpatient Hospital | Payer: Medicaid Other | Attending: Emergency Medicine | Admitting: Emergency Medicine

## 2022-04-12 ENCOUNTER — Encounter (HOSPITAL_COMMUNITY): Payer: Self-pay | Admitting: Emergency Medicine

## 2022-04-12 DIAGNOSIS — R4182 Altered mental status, unspecified: Secondary | ICD-10-CM | POA: Diagnosis not present

## 2022-04-12 DIAGNOSIS — F600 Paranoid personality disorder: Secondary | ICD-10-CM | POA: Diagnosis not present

## 2022-04-12 DIAGNOSIS — R45851 Suicidal ideations: Secondary | ICD-10-CM

## 2022-04-12 DIAGNOSIS — F332 Major depressive disorder, recurrent severe without psychotic features: Secondary | ICD-10-CM | POA: Insufficient documentation

## 2022-04-12 DIAGNOSIS — F411 Generalized anxiety disorder: Secondary | ICD-10-CM | POA: Insufficient documentation

## 2022-04-12 DIAGNOSIS — F1020 Alcohol dependence, uncomplicated: Secondary | ICD-10-CM | POA: Diagnosis not present

## 2022-04-12 DIAGNOSIS — Z20822 Contact with and (suspected) exposure to covid-19: Secondary | ICD-10-CM | POA: Diagnosis not present

## 2022-04-12 DIAGNOSIS — F419 Anxiety disorder, unspecified: Secondary | ICD-10-CM | POA: Diagnosis present

## 2022-04-12 HISTORY — DX: Schizoaffective disorder, unspecified: F25.9

## 2022-04-12 LAB — RESP PANEL BY RT-PCR (FLU A&B, COVID) ARPGX2
Influenza A by PCR: NEGATIVE
Influenza B by PCR: NEGATIVE
SARS Coronavirus 2 by RT PCR: NEGATIVE

## 2022-04-12 MED ORDER — ONDANSETRON HCL 4 MG PO TABS: 4.0000 mg | ORAL_TABLET | Freq: Three times a day (TID) | ORAL | Status: DC | PRN

## 2022-04-12 MED ORDER — ACETAMINOPHEN 325 MG PO TABS: 650.0000 mg | ORAL_TABLET | ORAL | Status: DC | PRN

## 2022-04-12 MED ORDER — ZIPRASIDONE MESYLATE 20 MG IM SOLR
10.0000 mg | Freq: Once | INTRAMUSCULAR | Status: DC
Start: 2022-04-12 — End: 2022-04-13
  Filled 2022-04-12: qty 20

## 2022-04-12 MED ORDER — NICOTINE 21 MG/24HR TD PT24: 21.0000 mg | MEDICATED_PATCH | Freq: Every day | TRANSDERMAL | Status: DC

## 2022-04-12 MED ORDER — ALUM & MAG HYDROXIDE-SIMETH 200-200-20 MG/5ML PO SUSP: 30.0000 mL | Freq: Four times a day (QID) | ORAL | Status: DC | PRN

## 2022-04-12 MED ORDER — STERILE WATER FOR INJECTION IJ SOLN
INTRAMUSCULAR | Status: AC
  Filled 2022-04-12: qty 10

## 2022-04-12 MED ORDER — ZOLPIDEM TARTRATE 5 MG PO TABS: 5.0000 mg | ORAL_TABLET | Freq: Every evening | ORAL | Status: DC | PRN

## 2022-04-12 MED ORDER — ZIPRASIDONE MESYLATE 20 MG IM SOLR: 20.0000 mg | Freq: Once | INTRAMUSCULAR | Status: DC

## 2022-04-12 NOTE — ED Provider Notes (Signed)
?[facility] EMERGENCY DEPARTMENT ?Provider Note ? ? ?CSN: [id number] ?Arrival date & time: 04/12/22  0128 ? ?  ? ?History ? ?Chief Complaint  ?Patient presents with  ? V70.1  ? ? ?Carlos Pineda is a 21 y.o. male. ? ?The history is provided by the patient.  ?He has history of attention deficit disorder, major depressive disorder and comes in because of suicidal ideation.  He is also complaining of generalized anxiety.  However, when I went to evaluate him, patient is now stating that he has never had suicidal ideation and anyone who heard him most of misunderstood and that I cannot prove that he has had suicidal ideation.  He declines to state what kind of suicidal plan he had.  He denies hallucinations but does admit to crying spells and early morning awakening.  He does deny drug use but admits to consuming some alcohol tonight. ?  ?Home Medications ?Prior to Admission medications   ?Not on File  ?   ? ?Allergies    ?Other   ? ?Review of Systems   ?Review of Systems  ?All other systems reviewed and are negative. ? ?Physical Exam ?Updated Vital Signs ?BP 128/66 (BP Location: Right Arm)   Pulse 87   Temp 97.9 ?F (36.6 ?C) (Oral)   Resp 16   Ht 5\' 8"  (1.727 m)   Wt 63.5 kg   SpO2 99%   BMI 21.29 kg/m?  ?Physical Exam ?Vitals and nursing note reviewed.  ?21 year old male, resting comfortably and in no acute distress. Vital signs are normal. Oxygen saturation is 99%, which is normal. ?Head is normocephalic and atraumatic. PERRLA, EOMI. Oropharynx is clear. ?Neck is nontender and supple without adenopathy or JVD. ?Back is nontender and there is no CVA tenderness. ?Lungs are clear without rales, wheezes, or rhonchi. ?Chest is nontender. ?Heart has regular rate and rhythm without murmur. ?Abdomen is soft, flat, nontender. ?Extremities have no cyanosis or edema, full range of motion is present. ?Skin is warm and dry without rash. ?Neurologic: Mental status is normal, cranial nerves are intact, moves all extremities  equally. ?Psychiatric: Emotionally labile, makes poor eye contact but speaks with normal inflections. ? ?ED Results / Procedures / Treatments   ?Labs ?(all labs ordered are listed, but only abnormal results are displayed) ?Labs Reviewed  ?RESP PANEL BY RT-PCR (FLU A&B, COVID) [id number]  ? ?Procedures ?Procedures  ? ? ?Medications Ordered in ED ?Medications  ?ziprasidone (GEODON) injection 20 mg (has no administration in time range)  ?nicotine (NICODERM CQ - dosed in mg/24 hours) patch 21 mg (has no administration in time range)  ?alum & mag hydroxide-simeth (MAALOX/MYLANTA) 200-200-20 MG/5ML suspension 30 mL (has no administration in time range)  ?ondansetron (ZOFRAN) tablet 4 mg (has no administration in time range)  ?zolpidem (AMBIEN) tablet 5 mg (has no administration in time range)  ?acetaminophen (TYLENOL) tablet 650 mg (has no administration in time range)  ? ? ?ED Course/ Medical Decision Making/ A&P ?  ?                        ?Medical Decision Making ?Risk ?OTC drugs. ?Prescription drug management. ? ? ?Major depressive disorder with suicidal ideation.  Patient tried to leave the emergency department, but I do not feel that his claim of no current suicidal ideation is sincere and I have convinced him to stay for evaluation by TTS. ? ?Final Clinical Impression(s) / ED Diagnoses ?Final diagnoses:  ?Suicidal ideation  ? ? ?  Rx / DC Orders ?ED Discharge Orders   ? ? None  ? ?  ? ? ?  ?Dione Booze, MD ?04/12/22 [phone] ? ?

## 2022-04-12 NOTE — ED Provider Notes (Signed)
Behavioral health requesting head CT no history of any trauma.  I think they are concerned about mental status change.  Behavioral health is requiring the head CT to continue with their evaluation and disposition. ?  ?Vanetta Mulders, MD ?04/12/22 [phone] ? ?
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
## 2022-04-12 NOTE — ED Triage Notes (Signed)
Pt c/o depression, paranoia, and fleeting SI ideations.  ?

## 2022-04-12 NOTE — ED Notes (Signed)
Pt has been accepted to [facility] tomorrow [facility] [place] [address] after 9am in the morning ? ?Nurse to call report to [phone]  ?

## 2022-04-12 NOTE — Progress Notes (Signed)
Inpatient Behavioral Health Placement ? ?Pt meets inpatient criteria per Lucianne Muss MD. There are no available beds at [facility] per [facility] Fransico Michael, RN.  Referral was sent to the following facilities;  ? ?Destination ?Service Provider Address Phone Fax  ?[facility]  [address] [place] [place] [address] [phone]  ?[facility]  [address] [place] [address] [phone]  ?[facility]  [address] [place], [place] [address] [phone]  ?[facility] [address] [place] [address] [phone]  ?[facility]  [address] [place] [address] [phone]  ?[facility]  [address] [place] [address] [phone]  ?[facility]  [address] [place] [address] [phone]  ?[facility]  [address], [place] [address] [phone]  ?[facility]  [address], [place] [address] [phone]  ?[facility]  [address] [place] [place] [address] [phone]  ?[facility]  [address] [place], [place] [address] [phone]  ?[facility]  [address] [place], [place] [address] [phone]  ?[facility]      ? ? ?Situation ongoing,  CSW will follow up. ? ? ?Maryjean Ka, MSW, LCSWA ?04/12/2022  @ 10:07 PM ? ?

## 2022-04-12 NOTE — Progress Notes (Signed)
Pt was accepted to [facility] 04/13/2022; Bed Assignment [address] ? ?Pt meets inpatient criteria per Dwyane Dee MD ? ?Attending Physician will be Dr. Mertha Baars ? ?Report can be called to: - [phone] ? ?Pt can arrive after 9:00am ? ?Care Team notified: Celedonio Savage, RN ? ?Nadara Mode, LCSWA ?04/12/2022 @ 10:50 PM ? ?

## 2022-04-12 NOTE — ED Notes (Signed)
TTS at this time. 

## 2022-04-12 NOTE — ED Notes (Signed)
Patient using the phone at this time. 

## 2022-04-12 NOTE — ED Notes (Signed)
Pt attempted to leave department, officers redirected pt back to bed. Pt is getting agitated, cussing at staff. Dr. Preston Fleeting made aware. ?

## 2022-04-12 NOTE — ED Notes (Signed)
Patient states he ate 100% of lunch. ?

## 2022-04-12 NOTE — ED Notes (Addendum)
Pt dressed out in burgundy scrubs, wanded by security. Belonging placed in [facility] locker room. Jewelry and wallet given to security  ?

## 2022-04-12 NOTE — BH Assessment (Signed)
TTS spoke with NS, Rosey Bath, to put pt in a private room to complete TTS assessment.  Clinician to cal the cart in five minutes. ?

## 2022-04-12 NOTE — ED Notes (Signed)
Talking to self, stated "just fucking kill me idgaf anymore" ?

## 2022-04-12 NOTE — ED Notes (Signed)
Mother Efraim Kaufmann, updated at this time. ?

## 2022-04-12 NOTE — ED Notes (Signed)
Ate 100% dinner

## 2022-04-13 NOTE — ED Notes (Signed)
Pt's belongings from lockers and security given to [employer] officer transporting pt.  ?

## 2022-04-13 NOTE — ED Notes (Signed)
Attempted to call report to [facility], phone line will not ring and only beeps showing "busy". Inetta Fermo Charge RN aware. Will continue to call.  ?

## 2022-09-08 ENCOUNTER — Emergency Department (HOSPITAL_COMMUNITY)
Admission: EM | Admit: 2022-09-08 | Discharge: 2022-09-08 | Discharge status: Against Medical Advice | Payer: Medicaid Other | Attending: Emergency Medicine | Admitting: Emergency Medicine

## 2022-09-08 ENCOUNTER — Emergency Department (HOSPITAL_COMMUNITY): Payer: Medicaid Other

## 2022-09-08 ENCOUNTER — Other Ambulatory Visit: Payer: Self-pay

## 2022-09-08 DIAGNOSIS — R112 Nausea with vomiting, unspecified: Secondary | ICD-10-CM | POA: Insufficient documentation

## 2022-09-08 DIAGNOSIS — R079 Chest pain, unspecified: Secondary | ICD-10-CM | POA: Insufficient documentation

## 2022-09-08 DIAGNOSIS — Z5321 Procedure and treatment not carried out due to patient leaving prior to being seen by health care provider: Secondary | ICD-10-CM | POA: Insufficient documentation

## 2022-09-08 DIAGNOSIS — R002 Palpitations: Secondary | ICD-10-CM | POA: Insufficient documentation

## 2022-09-08 LAB — CBC WITH DIFFERENTIAL/PLATELET
Abs Immature Granulocytes: 0.04 10*3/uL (ref 0.00–0.07)
Basophils Absolute: 0 10*3/uL (ref 0.0–0.1)
Basophils Relative: 0 %
Eosinophils Absolute: 0.1 10*3/uL (ref 0.0–0.5)
Eosinophils Relative: 1 %
HCT: 45 % (ref 39.0–52.0)
Hemoglobin: 15.1 g/dL (ref 13.0–17.0)
Immature Granulocytes: 0 %
Lymphocytes Relative: 24 %
Lymphs Abs: 2.7 10*3/uL (ref 0.7–4.0)
MCH: 30.6 pg (ref 26.0–34.0)
MCHC: 33.6 g/dL (ref 30.0–36.0)
MCV: 91.1 fL (ref 80.0–100.0)
Monocytes Absolute: 0.9 10*3/uL (ref 0.1–1.0)
Monocytes Relative: 7 %
Neutro Abs: 7.7 10*3/uL (ref 1.7–7.7)
Neutrophils Relative %: 68 %
Platelets: 355 10*3/uL (ref 150–400)
RBC: 4.94 MIL/uL (ref 4.22–5.81)
RDW: 13.2 % (ref 11.5–15.5)
WBC: 11.5 10*3/uL — ABNORMAL HIGH (ref 4.0–10.5)
nRBC: 0 % (ref 0.0–0.2)

## 2022-09-08 LAB — COMPREHENSIVE METABOLIC PANEL
ALT: 20 U/L (ref 0–44)
AST: 23 U/L (ref 15–41)
Albumin: 4.3 g/dL (ref 3.5–5.0)
Alkaline Phosphatase: 69 U/L (ref 38–126)
Anion gap: 10 (ref 5–15)
BUN: 14 mg/dL (ref 6–20)
CO2: 24 mmol/L (ref 22–32)
Calcium: 9.5 mg/dL (ref 8.9–10.3)
Chloride: 108 mmol/L (ref 98–111)
Creatinine, Ser: 1.12 mg/dL (ref 0.61–1.24)
GFR, Estimated: >60 mL/min (ref >60)
Glucose, Bld: 80 mg/dL (ref 70–99)
Potassium: 4 mmol/L (ref 3.5–5.1)
Sodium: 142 mmol/L (ref 135–145)
Total Bilirubin: 1.5 mg/dL — ABNORMAL HIGH (ref 0.3–1.2)
Total Protein: 6.7 g/dL (ref 6.5–8.1)

## 2022-09-08 LAB — RAPID URINE DRUG SCREEN, HOSP PERFORMED
Amphetamines: NOT DETECTED
Barbiturates: NOT DETECTED
Benzodiazepines: NOT DETECTED
Cocaine: NOT DETECTED
Opiates: NOT DETECTED
Tetrahydrocannabinol: NOT DETECTED

## 2022-09-08 LAB — ETHANOL: Alcohol, Ethyl (B): <10 mg/dL (ref <10)

## 2022-09-08 LAB — LIPASE, BLOOD: Lipase: 27 U/L (ref 11–51)

## 2022-09-08 LAB — TROPONIN I (HIGH SENSITIVITY)
Troponin I (High Sensitivity): 5 ng/L (ref <18)
Troponin I (High Sensitivity): 5 ng/L (ref <18)

## 2022-09-08 LAB — TSH: TSH: 0.986 u[IU]/mL (ref 0.350–4.500)

## 2022-09-08 NOTE — ED Provider Triage Note (Signed)
Emergency Medicine Provider Triage Evaluation Note  Carlos Pineda , a 21 y.o. male  was evaluated in triage.  Pt complains of chest pain.  He states that he has been having chest pain for several months.  Chest pain comes and goes.  Patient is drinking fairly heavily.  He had 4,40 ounce's last night.  He denies.  Nausea vomiting.  He does not mention vision change.  He complains of palpitations.  Review of Systems  Positive: CP Negative: LOC  Physical Exam  BP (!) 176/89 (BP Location: Right Arm)   Pulse (!) 108   Temp 98.6 F (37 C) (Oral)   Resp 16   SpO2 100%  Gen:   Awake, no distress   Resp:  Normal effort  MSK:   Moves extremities without difficulty  Other:  NO MURMUR  Medical Decision Making  Medically screening exam initiated at 4:03 PM.  Appropriate orders placed.  ISSACC MERLO was informed that the remainder of the evaluation will be completed by another provider, this initial triage assessment does not replace that evaluation, and the importance of remaining in the ED until their evaluation is complete.  WORK UP Prince Solian, PA-C 09/08/22 1609

## 2022-09-08 NOTE — ED Notes (Signed)
Pt states they are leaving, encouraged pt to stay. Pt reports that they are feeling better and would rather follow up with their cardiologist at a later date.

## 2022-09-08 NOTE — ED Triage Notes (Signed)
Pt here for palpitations that he states has been going on for months. Pt reports he has been drinking more alcohol than normal recently. Per girlfriend pt drank 4 40oz beers last night. Pt denies tremors/withdrawal symptoms.

## 2022-11-05 IMAGING — CT CT HEAD W/O CM
3 of 4 series · 15 of 47 positions shown, 18 images · non-contrast
Comparison: Head CT 10/11/2021

CLINICAL DATA: Anxiety, suicidal ideation, altered mental status



[Series 2: head w o · axial · 0.43mm/px · z∈[-1,+119]mm · 9 of 30 slices shown, 12 images]
[im 3/30  brain]
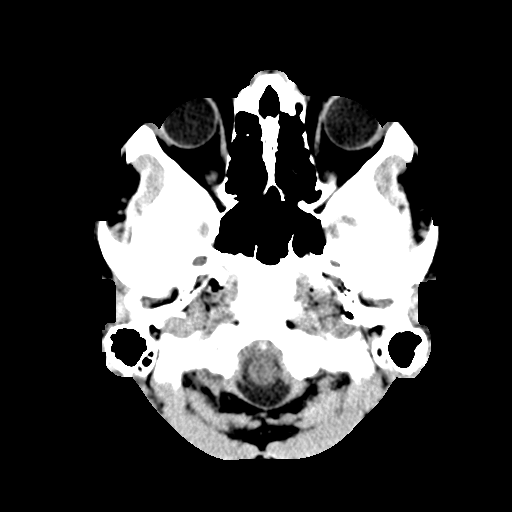
[im 3/30  bone]
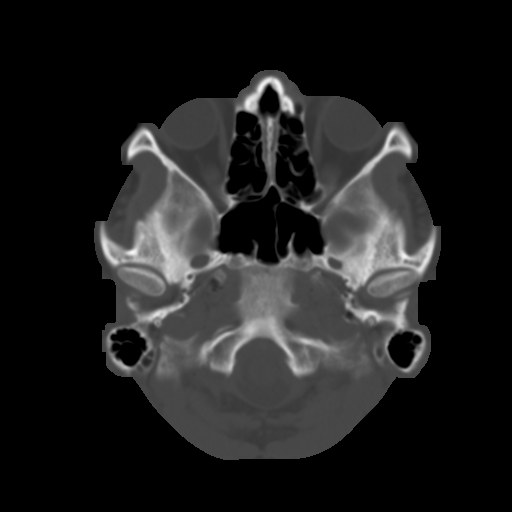
[im 7/30  brain]
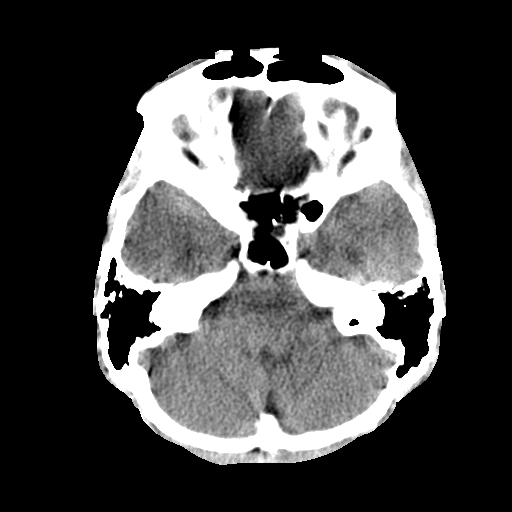
[im 9/30  brain]
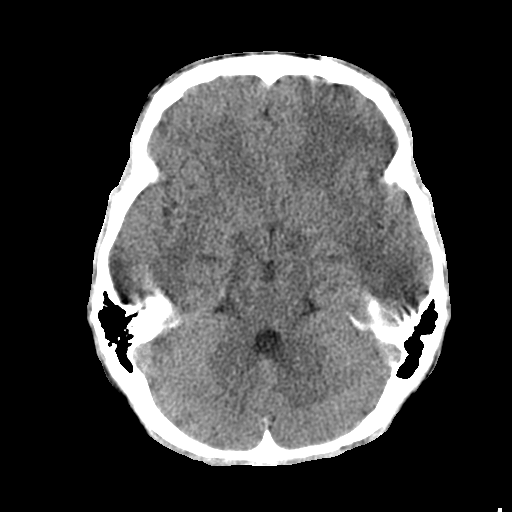
[im 13/30  brain]
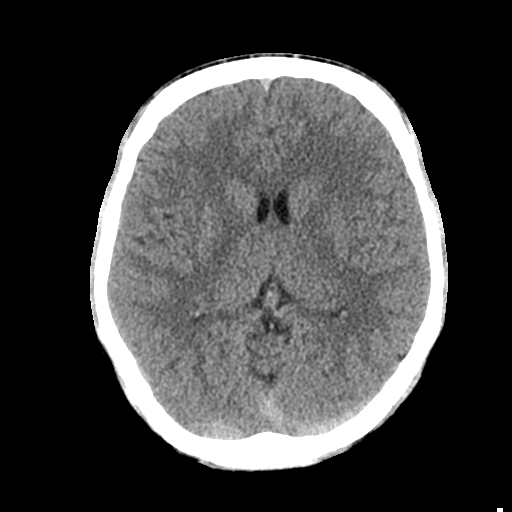
[im 15/30  brain]
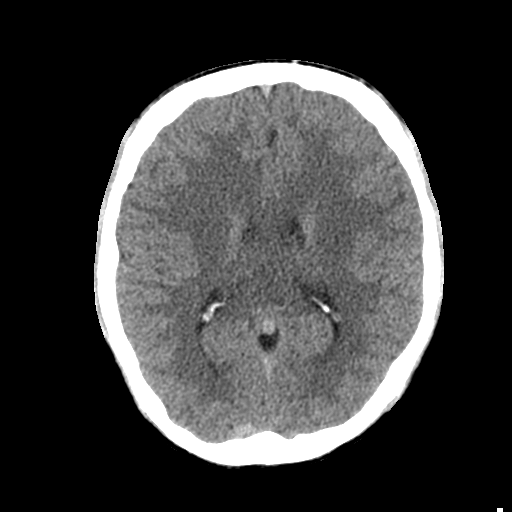
[im 15/30  bone]
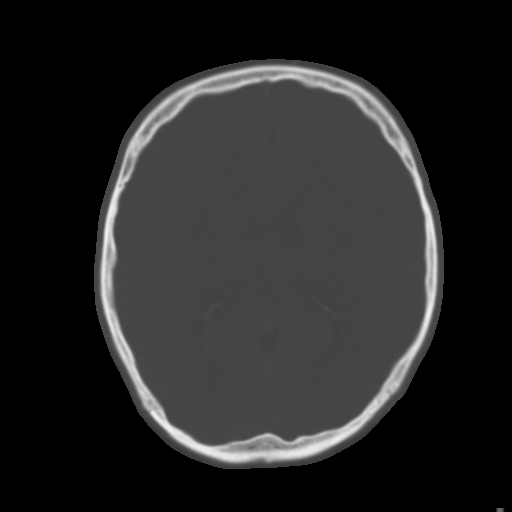
[im 17/30  brain]
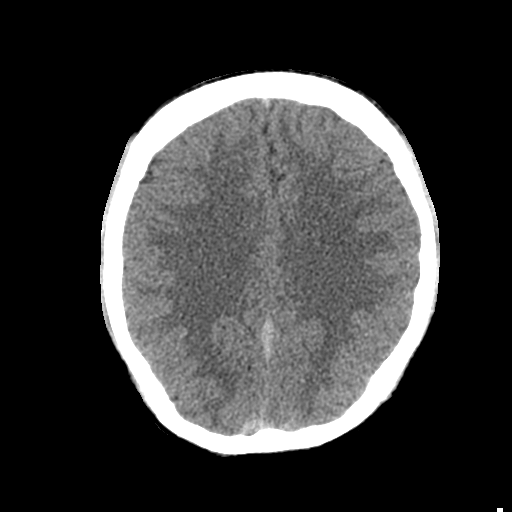
[im 21/30  brain]
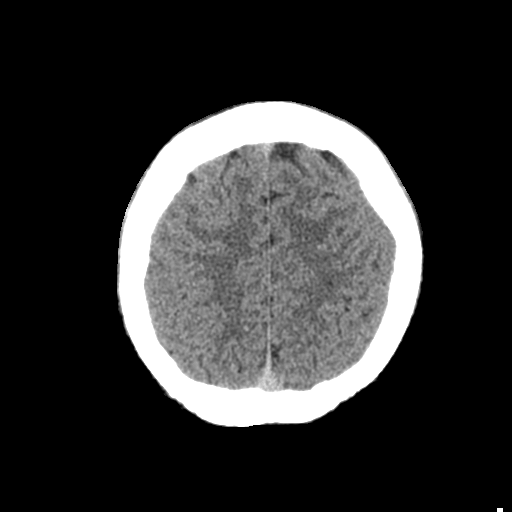
[im 23/30  brain]
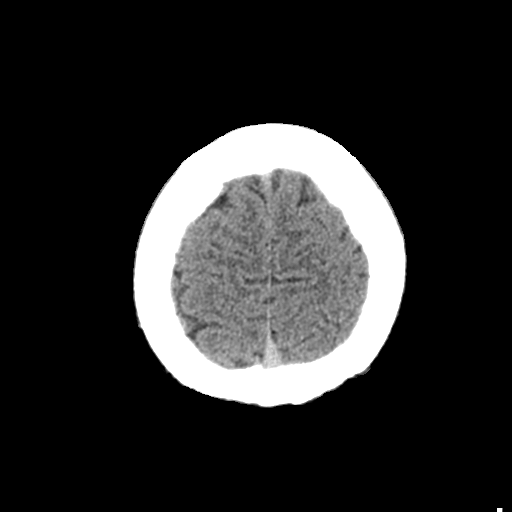
[im 27/30  brain]
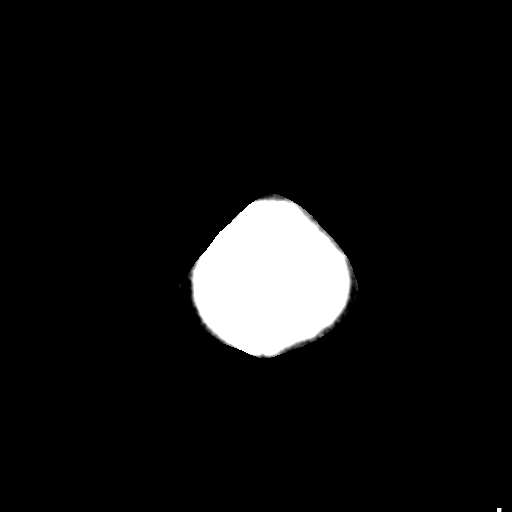
[im 27/30  bone]
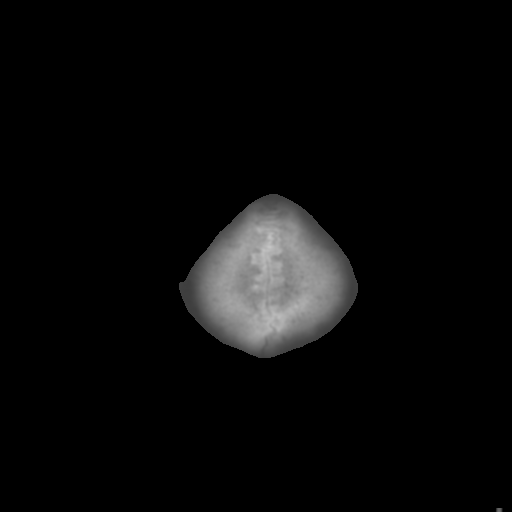

[Series 4: coronal soft · coronal · 0.30mm/px · 3 of 70 slices shown]
[im 24/70  brain]
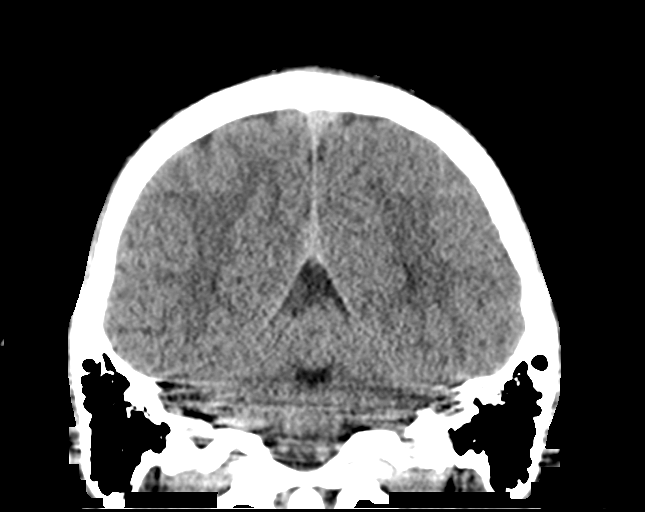
[im 31/70  brain]
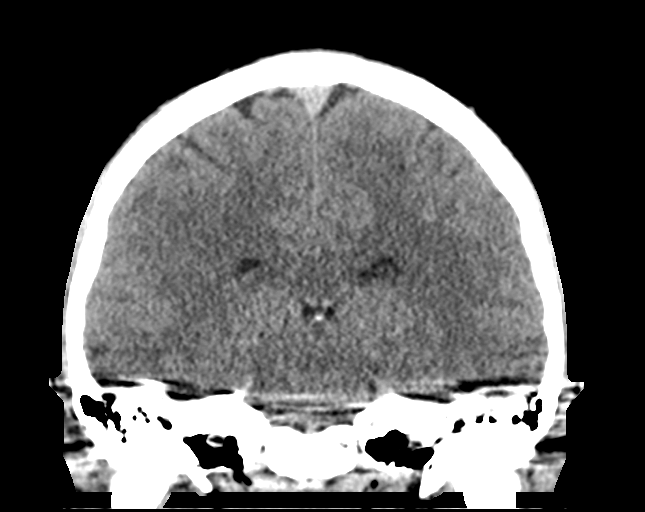
[im 39/70  brain]
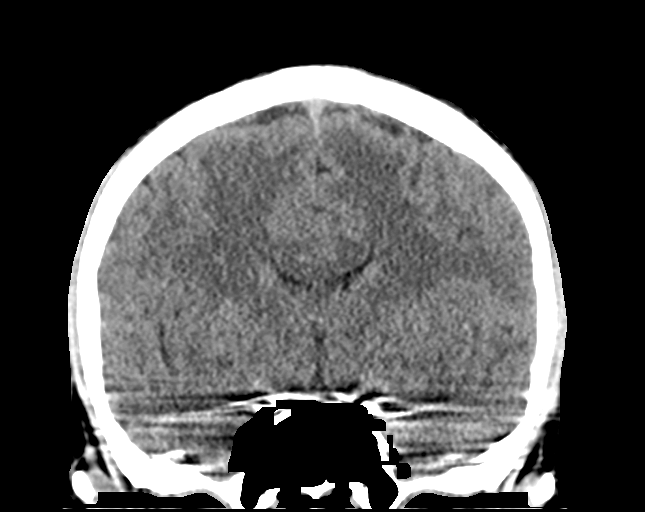

[Series 5: sagittal soft · sagittal · 0.32mm/px · 3 of 66 slices shown]
[im 22/66  brain]
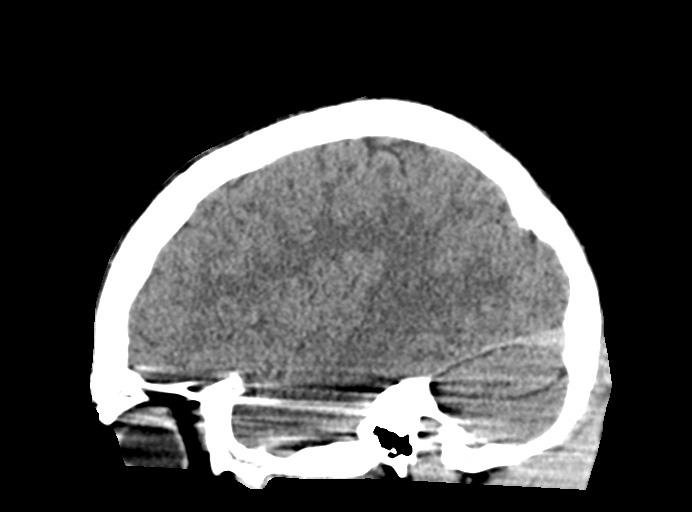
[im 33/66  brain]
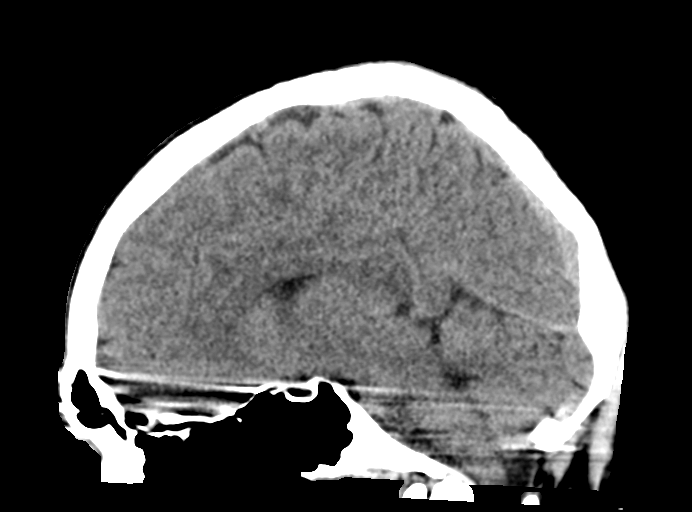
[im 44/66  brain]
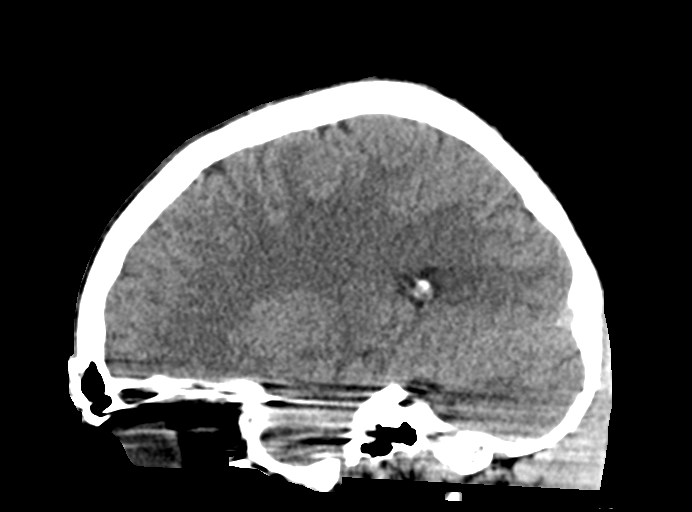

[15 of 47 positions shown; findings below may reference images not displayed]

FINDINGS: Brain: There is no acute intracranial hemorrhage, extra-axial fluid
collection, or acute infarct.

Parenchymal volume is normal. The ventricles are normal in size.
Gray-white differentiation is preserved.

There is no mass lesion.  There is no mass effect or midline shift.

Vascular: No hyperdense vessel or unexpected calcification.

Skull: Normal. Negative for fracture or focal lesion.

Sinuses/Orbits: The imaged paranasal sinuses are clear. The imaged
globes and orbits are unremarkable.

Other: None.
IMPRESSION: Normal head CT.

## 2023-10-09 ENCOUNTER — Encounter (HOSPITAL_COMMUNITY): Payer: Self-pay

## 2023-10-09 ENCOUNTER — Other Ambulatory Visit: Payer: Self-pay

## 2023-10-09 ENCOUNTER — Emergency Department (HOSPITAL_COMMUNITY)
Admission: EM | Admit: 2023-10-09 | Discharge: 2023-10-09 | Disposition: A | Discharge status: Home / Self Care | Payer: MEDICAID | Attending: Emergency Medicine | Admitting: Emergency Medicine

## 2023-10-09 ENCOUNTER — Emergency Department (HOSPITAL_COMMUNITY): Payer: MEDICAID

## 2023-10-09 DIAGNOSIS — R1030 Lower abdominal pain, unspecified: Secondary | ICD-10-CM

## 2023-10-09 DIAGNOSIS — D72829 Elevated white blood cell count, unspecified: Secondary | ICD-10-CM | POA: Insufficient documentation

## 2023-10-09 DIAGNOSIS — R1031 Right lower quadrant pain: Secondary | ICD-10-CM | POA: Diagnosis present

## 2023-10-09 DIAGNOSIS — K921 Melena: Secondary | ICD-10-CM | POA: Diagnosis not present

## 2023-10-09 DIAGNOSIS — F17210 Nicotine dependence, cigarettes, uncomplicated: Secondary | ICD-10-CM | POA: Diagnosis not present

## 2023-10-09 LAB — URINALYSIS, W/ REFLEX TO CULTURE (INFECTION SUSPECTED)
Bacteria, UA: NONE SEEN
Bilirubin Urine: NEGATIVE
Glucose, UA: NEGATIVE mg/dL
Hgb urine dipstick: NEGATIVE
Ketones, ur: NEGATIVE mg/dL
Leukocytes,Ua: NEGATIVE
Nitrite: NEGATIVE
Protein, ur: NEGATIVE mg/dL
Specific Gravity, Urine: 1.016 (ref 1.005–1.030)
pH: 6 (ref 5.0–8.0)

## 2023-10-09 LAB — COMPREHENSIVE METABOLIC PANEL
ALT: 18 U/L (ref 0–44)
AST: 15 U/L (ref 15–41)
Albumin: 4.4 g/dL (ref 3.5–5.0)
Alkaline Phosphatase: 62 U/L (ref 38–126)
Anion gap: 6 (ref 5–15)
BUN: 17 mg/dL (ref 6–20)
CO2: 25 mmol/L (ref 22–32)
Calcium: 9.4 mg/dL (ref 8.9–10.3)
Chloride: 107 mmol/L (ref 98–111)
Creatinine, Ser: 0.82 mg/dL (ref 0.61–1.24)
GFR, Estimated: >60 mL/min (ref >60)
Glucose, Bld: 100 mg/dL — ABNORMAL HIGH (ref 70–99)
Potassium: 3.9 mmol/L (ref 3.5–5.1)
Sodium: 138 mmol/L (ref 135–145)
Total Bilirubin: 1.2 mg/dL (ref 0.3–1.2)
Total Protein: 7.3 g/dL (ref 6.5–8.1)

## 2023-10-09 LAB — CBC WITH DIFFERENTIAL/PLATELET
Abs Immature Granulocytes: 0.03 10*3/uL (ref 0.00–0.07)
Basophils Absolute: 0 10*3/uL (ref 0.0–0.1)
Basophils Relative: 0 %
Eosinophils Absolute: 0.1 10*3/uL (ref 0.0–0.5)
Eosinophils Relative: 1 %
HCT: 45 % (ref 39.0–52.0)
Hemoglobin: 14.9 g/dL (ref 13.0–17.0)
Immature Granulocytes: 0 %
Lymphocytes Relative: 22 %
Lymphs Abs: 2.5 10*3/uL (ref 0.7–4.0)
MCH: 29.9 pg (ref 26.0–34.0)
MCHC: 33.1 g/dL (ref 30.0–36.0)
MCV: 90.2 fL (ref 80.0–100.0)
Monocytes Absolute: 0.7 10*3/uL (ref 0.1–1.0)
Monocytes Relative: 6 %
Neutro Abs: 8.1 10*3/uL — ABNORMAL HIGH (ref 1.7–7.7)
Neutrophils Relative %: 71 %
Platelets: 332 10*3/uL (ref 150–400)
RBC: 4.99 MIL/uL (ref 4.22–5.81)
RDW: 12.9 % (ref 11.5–15.5)
WBC: 11.4 10*3/uL — ABNORMAL HIGH (ref 4.0–10.5)
nRBC: 0 % (ref 0.0–0.2)

## 2023-10-09 LAB — ABO/RH: ABO/RH(D): A POS

## 2023-10-09 LAB — TYPE AND SCREEN
ABO/RH(D): A POS
Antibody Screen: NEGATIVE

## 2023-10-09 LAB — LIPASE, BLOOD: Lipase: 28 U/L (ref 11–51)

## 2023-10-09 LAB — POC OCCULT BLOOD, ED: Fecal Occult Bld: NEGATIVE

## 2023-10-09 MED ORDER — ONDANSETRON 4 MG PO TBDP: 4.0000 mg | ORAL_TABLET | Freq: Three times a day (TID) | ORAL | 0 refills | Status: AC | PRN

## 2023-10-09 MED ORDER — ONDANSETRON HCL 4 MG/2ML IJ SOLN
4.0000 mg | Freq: Once | INTRAMUSCULAR | Status: AC
  Administered 2023-10-09: 4 mg via INTRAVENOUS
  Filled 2023-10-09: qty 2

## 2023-10-09 MED ORDER — IOHEXOL 300 MG/ML  SOLN
100.0000 mL | Freq: Once | INTRAMUSCULAR | Status: AC | PRN
  Administered 2023-10-09: 100 mL via INTRAVENOUS

## 2023-10-09 MED ORDER — DICYCLOMINE HCL 20 MG PO TABS
20.0000 mg | ORAL_TABLET | Freq: Two times a day (BID) | ORAL | 0 refills | Status: AC
Start: 2023-10-09 — End: 2023-10-14

## 2023-10-09 MED ORDER — MORPHINE SULFATE (PF) 4 MG/ML IV SOLN
4.0000 mg | Freq: Once | INTRAVENOUS | Status: AC
  Administered 2023-10-09: 4 mg via INTRAVENOUS
  Filled 2023-10-09: qty 1

## 2023-10-09 NOTE — Discharge Instructions (Addendum)
You have been seen today for your complaint of lower abdominal pain. Your lab work was reassuring. Your imaging was reassuring. Your discharge medications include Zofran.  This is a nausea medicine.  Take it as needed when you feel nauseous. Bentyl.  This is a medicine used to help with abdominal pain.  Take it as needed. Follow up with: Your primary care doctor in 1 week for reevaluation Please seek immediate medical care if you develop any of the following symptoms: You cannot stop vomiting. Your pain is only in one part of the abdomen. Pain on the right side could be caused by appendicitis. You have bloody or black poop (stool), or poop that looks like tar. You have trouble breathing. You have chest pain. At this time there does not appear to be the presence of an emergent medical condition, however there is always the potential for conditions to change. Please read and follow the below instructions.  Do not take your medicine if  develop an itchy rash, swelling in your mouth or lips, or difficulty breathing; call 911 and seek immediate emergency medical attention if this occurs.  You may review your lab tests and imaging results in their entirety on your MyChart account.  Please discuss all results of fully with your primary care provider and other specialist at your follow-up visit.  Note: Portions of this text may have been transcribed using voice recognition software. Every effort was made to ensure accuracy; however, inadvertent computerized transcription errors may still be present.

## 2023-10-09 NOTE — ED Triage Notes (Addendum)
Pt complaining of abd pain, melania (dark color), fatigue, and dizziness. Pt states s/s have been progressing over there course of a week. Pt does endorse nausea, no vomiting.

## 2023-10-09 NOTE — ED Provider Notes (Signed)
[place] EMERGENCY DEPARTMENT AT [facility] Provider Note   CSN: [id number] Arrival date & time: 10/09/23  1134     History  Chief Complaint  Patient presents with   Abdominal Pain   Melena         Carlos Pineda is a 22 y.o. male.  With history of ADHD, schizophrenia, depression presenting to the ED for evaluation of abdominal pain, fatigue.  He has had intermittent lower abdominal pains for the past 2 weeks.  He states he got married on Sunday and the abdominal pain began to get worse at that time.  It is suprapubic and right lower quadrant and described as a aching and stabbing pain.  He states today he woke up with dark red stools, fatigue and lightheadedness.  He has some intermittent nausea but no vomiting.  He denies any bright red blood per rectum.  No history of external hemorrhoids or rectal bleeding in the past.  No fevers, chills, chest pain, shortness of breath.  He denies any recreational drug use or alcohol use.  He does smoke cigarettes.  He reports some rectal discomfort while having bowel movements as well and describes this as a sharp sensation.  He denies any dysuria, frequency, urgency, testicle pain.   Abdominal Pain Associated symptoms: fatigue        Home Medications Prior to Admission medications   Medication Sig Start Date End Date Taking? Authorizing Provider  dicyclomine (BENTYL) 20 MG tablet Take 1 tablet (20 mg total) by mouth 2 (two) times daily for 5 days. 10/09/23 10/14/23 Yes Maudell Stanbrough, Edsel Petrin, PA-C  ondansetron (ZOFRAN-ODT) 4 MG disintegrating tablet Take 1 tablet (4 mg total) by mouth every 8 (eight) hours as needed for nausea or vomiting. 10/09/23  Yes Adreona Brand, Edsel Petrin, PA-C  citalopram (CELEXA) 20 MG tablet Take 20 mg by mouth daily. 04/04/22   [provider]  traZODone (DESYREL) 100 MG tablet Take 100 mg by mouth at bedtime. 04/04/22   [provider]      Allergies    Other    Review of Systems   Review  of Systems  Constitutional:  Positive for fatigue.  Gastrointestinal:  Positive for abdominal pain and blood in stool.  All other systems reviewed and are negative.   Physical Exam Updated Vital Signs BP 124/62 (BP Location: Right Arm)   Pulse 68   Temp 98 F (36.7 C) (Oral)   Resp 18   Ht 5\' 8"  (1.727 m)   Wt 65.8 kg   SpO2 98%   BMI 22.05 kg/m  Physical Exam Vitals and nursing note reviewed. Exam conducted with a chaperone present Colin Mulders, NT).  Constitutional:      General: He is not in acute distress.    Appearance: He is well-developed.     Comments: Resting comfortably in bed  HENT:     Head: Normocephalic and atraumatic.  Eyes:     Conjunctiva/sclera: Conjunctivae normal.  Cardiovascular:     Rate and Rhythm: Normal rate and regular rhythm.     Heart sounds: No murmur heard. Pulmonary:     Effort: Pulmonary effort is normal. No respiratory distress.     Breath sounds: Normal breath sounds. No wheezing, rhonchi or rales.  Abdominal:     General: Abdomen is flat.     Palpations: Abdomen is soft.     Tenderness: There is abdominal tenderness in the right lower quadrant and suprapubic area. There is no guarding.  Genitourinary:  Comments: No external masses or lesions.  No internal masses, deformities, step-offs.  Normal sphincter tone.  Observed stool was light brown. Musculoskeletal:        General: No swelling.     Cervical back: Neck supple.  Skin:    General: Skin is warm and dry.     Capillary Refill: Capillary refill takes less than 2 seconds.  Neurological:     Mental Status: He is alert.  Psychiatric:        Mood and Affect: Mood normal.     ED Results / Procedures / Treatments   Labs (all labs ordered are listed, but only abnormal results are displayed) Labs Reviewed  COMPREHENSIVE METABOLIC PANEL - Abnormal; Notable for the following components:      Result Value   Glucose, Bld 100 (*)    All other components within normal limits  CBC WITH  DIFFERENTIAL/PLATELET - Abnormal; Notable for the following components:   WBC 11.4 (*)    Neutro Abs 8.1 (*)    All other components within normal limits  LIPASE, BLOOD  URINALYSIS, W/ REFLEX TO CULTURE (INFECTION SUSPECTED)  POC OCCULT BLOOD, ED  TYPE AND SCREEN  ABO/RH    EKG None  Radiology CT ABDOMEN PELVIS W CONTRAST  Result Date: 10/09/2023 CLINICAL DATA: Worsening abdominal pain for 1 week.  Nausea.  Melena. EXAM: CT ABDOMEN AND PELVIS WITH CONTRAST TECHNIQUE: Multidetector CT imaging of the abdomen and pelvis was performed using the standard protocol following bolus administration of intravenous contrast. RADIATION DOSE REDUCTION: This exam was performed according to the departmental dose-optimization program which includes automated exposure control, adjustment of the mA and/or kV according to patient size and/or use of iterative reconstruction technique. CONTRAST:  OMNIPAQUE IOHEXOL 300 MG/ML  SOLN COMPARISON:  08/18/2021 FINDINGS: Lower Chest: No acute findings. Hepatobiliary: No suspicious hepatic masses identified. Gallbladder is unremarkable. No evidence of biliary ductal dilatation. Pancreas:  No mass or inflammatory changes. Spleen: Within normal limits in size and appearance. Adrenals/Urinary Tract: No suspicious masses identified. No evidence of ureteral calculi or hydronephrosis. Stomach/Bowel: No evidence of obstruction, inflammatory process or abnormal fluid collections. Normal appendix visualized. Vascular/Lymphatic: No pathologically enlarged lymph nodes. No acute vascular findings. Reproductive:  No mass or other significant abnormality. Other:  None. Musculoskeletal:  No suspicious bone lesions identified. IMPRESSION: Negative. No acute findings or other significant abnormality. Electronically Signed   By: Danae Orleans M.D.   On: 10/09/2023 15:19    Procedures Procedures    Medications Ordered in ED Medications  ondansetron (ZOFRAN) injection 4 mg (4 mg  Intravenous Given 10/09/23 1339)  morphine (PF) 4 MG/ML injection 4 mg (4 mg Intravenous Given 10/09/23 1339)  iohexol (OMNIPAQUE) 300 MG/ML solution 100 mL (100 mLs Intravenous Contrast Given 10/09/23 1328)    ED Course/ Medical Decision Making/ A&P                                 Medical Decision Making Amount and/or Complexity of Data Reviewed Labs: ordered. Radiology: ordered.  Risk Prescription drug management.  This patient presents to the ED for concern of lower abdominal pain, this involves an extensive number of treatment options, and is a complaint that carries with it a high risk of complications and morbidity. The differential diagnosis for generalized abdominal pain includes, but is not limited to AAA, gastroenteritis, appendicitis, Bowel obstruction, Bowel perforation. Gastroparesis, DKA, Hernia, Inflammatory bowel disease, mesenteric ischemia, pancreatitis, peritonitis  SBP, volvulus.   My initial workup includes labs, imaging, DRE, symptom control  Additional history obtained from: Nursing notes from this visit.  I ordered, reviewed and interpreted labs which include: CBC, CMP, lipase, urinalysis, type and screen.  Mild leukocytosis of 11.4.  Labs otherwise reassuring.  I ordered imaging studies including CT abdomen pelvis I independently visualized and interpreted imaging which showed negative I agree with the radiologist interpretation  Afebrile, hemodynamically stable.  22 year old male presenting to the ED for evaluation of lower abdominal pain.  This is intermittent over the past 2 weeks.  Today he had some darker colored stools and was concerned for blood in the stool.  He appears very well on physical exam.  Mild tenderness to palpation of the lower abdomen.  DRE was negative for any blood or masses.  Lab workup was reassuring and without abnormalities.  CT abdomen pelvis was negative.  Low suspicion for acute intra-abdominal emergencies at this time.  Per my  interpretation, there does appear to be a moderate colonic stool burden.  Constipation may be contributing to his symptoms.  He was encouraged to take MiraLAX to have a normal bowel movement as he was reporting some constipation lately.  He was sent prescription for Zofran and Bentyl for symptoms.  He was given return precautions.  Stable at discharge.  At this time there does not appear to be any evidence of an acute emergency medical condition and the patient appears stable for discharge with appropriate outpatient follow up. Diagnosis was discussed with patient who verbalizes understanding of care plan and is agreeable to discharge. I have discussed return precautions with patient who verbalizes understanding. Patient encouraged to follow-up with their PCP within 1 week. All questions answered.  Note: Portions of this report may have been transcribed using voice recognition software. Every effort was made to ensure accuracy; however, inadvertent computerized transcription errors may still be present.        Final Clinical Impression(s) / ED Diagnoses Final diagnoses:  Lower abdominal pain    Rx / DC Orders ED Discharge Orders          Ordered    dicyclomine (BENTYL) 20 MG tablet  2 times daily        10/09/23 1643    ondansetron (ZOFRAN-ODT) 4 MG disintegrating tablet  Every 8 hours PRN        10/09/23 1643              Michelle Piper, PA-C 10/09/23 1644    Rolan Bucco, MD 10/10/23 [phone]

## 2024-08-25 ENCOUNTER — Other Ambulatory Visit: Payer: Self-pay

## 2024-08-25 ENCOUNTER — Emergency Department (HOSPITAL_COMMUNITY)
Admission: EM | Admit: 2024-08-25 | Discharge: 2024-08-26 | Discharge status: Against Medical Advice | Payer: MEDICAID | Attending: Emergency Medicine | Admitting: Emergency Medicine

## 2024-08-25 ENCOUNTER — Encounter (HOSPITAL_COMMUNITY): Payer: Self-pay

## 2024-08-25 DIAGNOSIS — Z5321 Procedure and treatment not carried out due to patient leaving prior to being seen by health care provider: Secondary | ICD-10-CM | POA: Diagnosis not present

## 2024-08-25 DIAGNOSIS — R079 Chest pain, unspecified: Secondary | ICD-10-CM | POA: Insufficient documentation

## 2024-08-25 LAB — CBC WITH DIFFERENTIAL/PLATELET
Abs Immature Granulocytes: 0.03 K/uL (ref 0.00–0.07)
Basophils Absolute: 0 K/uL (ref 0.0–0.1)
Basophils Relative: 0 %
Eosinophils Absolute: 0.1 K/uL (ref 0.0–0.5)
Eosinophils Relative: 1 %
HCT: 45.8 % (ref 39.0–52.0)
Hemoglobin: 15.3 g/dL (ref 13.0–17.0)
Immature Granulocytes: 0 %
Lymphocytes Relative: 33 %
Lymphs Abs: 3.8 K/uL (ref 0.7–4.0)
MCH: 29.5 pg (ref 26.0–34.0)
MCHC: 33.4 g/dL (ref 30.0–36.0)
MCV: 88.2 fL (ref 80.0–100.0)
Monocytes Absolute: 0.8 K/uL (ref 0.1–1.0)
Monocytes Relative: 7 %
Neutro Abs: 6.7 K/uL (ref 1.7–7.7)
Neutrophils Relative %: 59 %
Platelets: 401 K/uL — ABNORMAL HIGH (ref 150–400)
RBC: 5.19 MIL/uL (ref 4.22–5.81)
RDW: 13.2 % (ref 11.5–15.5)
WBC: 11.4 K/uL — ABNORMAL HIGH (ref 4.0–10.5)
nRBC: 0 % (ref 0.0–0.2)

## 2024-08-25 LAB — TROPONIN I (HIGH SENSITIVITY)
Troponin I (High Sensitivity): 3 ng/L (ref <18)
Troponin I (High Sensitivity): 4 ng/L (ref <18)

## 2024-08-25 LAB — COMPREHENSIVE METABOLIC PANEL WITH GFR
ALT: 17 U/L (ref 0–44)
AST: 18 U/L (ref 15–41)
Albumin: 4.3 g/dL (ref 3.5–5.0)
Alkaline Phosphatase: 73 U/L (ref 38–126)
Anion gap: 7 (ref 5–15)
BUN: 15 mg/dL (ref 6–20)
CO2: 26 mmol/L (ref 22–32)
Calcium: 9.4 mg/dL (ref 8.9–10.3)
Chloride: 103 mmol/L (ref 98–111)
Creatinine, Ser: 1.03 mg/dL (ref 0.61–1.24)
GFR, Estimated: >60 mL/min (ref >60)
Glucose, Bld: 94 mg/dL (ref 70–99)
Potassium: 3.8 mmol/L (ref 3.5–5.1)
Sodium: 136 mmol/L (ref 135–145)
Total Bilirubin: 2.2 mg/dL — ABNORMAL HIGH (ref 0.0–1.2)
Total Protein: 7.4 g/dL (ref 6.5–8.1)

## 2024-08-25 NOTE — ED Triage Notes (Signed)
 Pt presents with a dull L sided CP that is reproducible to palpation and movement. It was first noticed last night and is worse today. 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

## 2024-08-25 NOTE — ED Provider Triage Note (Signed)
 Emergency Medicine Provider Triage Evaluation Note  Carlos Pineda , a 23 y.o. male  was evaluated in triage.  Pt complains of chest pain.  Patient with past history significant for prior cardiac surgery states he has been having dull left-sided chest pain that is reproducible and merrily with palpation and movement.  Reports he first noticed this pain last night and worsened today.  He is unsure if this is possibly a pulled muscle.  Review of Systems  Positive: As above Negative: As above  Physical Exam  BP (!) 154/84   Pulse 87   Temp 98.3 F (36.8 C) (Oral)   Resp 16   Ht 5' 8 (1.727 m)   Wt 65.8 kg   SpO2 99%   BMI 22.05 kg/m  Gen:   Awake, no distress   Resp:  Normal effort MSK:   Moves extremities without difficulty  Other:    Medical Decision Making  Medically screening exam initiated at 8:19 PM.  Appropriate orders placed.  Carlos Pineda was informed that the remainder of the evaluation will be completed by another provider, this initial triage assessment does not replace that evaluation, and the importance of remaining in the ED until their evaluation is complete.     Henna Derderian A, PA-C 08/25/24 2020

## 2024-10-20 ENCOUNTER — Ambulatory Visit (HOSPITAL_COMMUNITY)
Admission: EM | Admit: 2024-10-20 | Discharge: 2024-10-20 | Discharge status: Against Medical Advice | Attending: Family Medicine | Admitting: Family Medicine

## 2024-10-20 NOTE — ED Notes (Signed)
No answer in waiting area.

## 2024-10-20 NOTE — ED Triage Notes (Signed)
 No answer in waiting x2.
